# Patient Record
Sex: Female | Born: 1995 | Race: Black or African American | Hispanic: No | Marital: Single | Smoking: Never smoker
Health system: Southern US, Community
[De-identification: ages and names within clinical notes are randomized; demographics above are authoritative.]

---

## 2019-02-22 ENCOUNTER — Ambulatory Visit: Payer: Medicaid Other | Admitting: *Deleted

## 2019-02-22 ENCOUNTER — Other Ambulatory Visit: Payer: Self-pay

## 2019-02-22 DIAGNOSIS — Z3480 Encounter for supervision of other normal pregnancy, unspecified trimester: Secondary | ICD-10-CM

## 2019-02-22 DIAGNOSIS — Z3400 Encounter for supervision of normal first pregnancy, unspecified trimester: Secondary | ICD-10-CM | POA: Insufficient documentation

## 2019-02-22 NOTE — Progress Notes (Signed)
I connected with  Katherine Schmidt on 02/22/19 by a video enabled telemedicine application and verified that I am speaking with the correct person using two identifiers.   I discussed the limitations of evaluation and management by telemedicine. The patient expressed understanding and agreed to proceed.  PRENATAL INTAKE SUMMARY  Katherine Schmidt presents today New OB Nurse Interview.  OB History    Gravida  1   Para      Term      Preterm      AB      Living        SAB      TAB      Ectopic      Multiple      Live Births             I have reviewed the patient's medical, obstetrical, social, and family histories, medications, and available lab results.  SUBJECTIVE She complains of headache Pt states currently being tx for UTI  OBJECTIVE Initial Physical Exam (New OB)  GENERAL APPEARANCE: alert, well appearing   ASSESSMENT Normal pregnancy  PLAN Prenatal care [facility] Continue to take UTI Rx  Advised to take 2 extra strength Tylenol every 6 hours or as needed for HA.  Discuss with provider at visit if no relief.

## 2019-02-22 NOTE — Progress Notes (Signed)
Patient seen and assessed by nursing staff during this encounter. I have reviewed the chart and agree with the documentation and plan.  Mora Bellman, MD 02/22/2019 10:49 AM

## 2019-02-23 NOTE — L&D Delivery Note (Signed)
OB/GYN Faculty Practice Delivery Note  Margretta Crislip is a 24 y.o. G1P0 s/p vaginal delivery at [redacted]w[redacted]d. She was admitted for spontaneous labor.   ROM: 0h 27m with clear fluid GBS Status:  Negative/-- (06/17 1028) Maximum Maternal Temperature:  Temp (24hrs), Avg:98 F (36.7 C), Min:98 F (36.7 C), Max:98 F (36.7 C)    Labor Progress: . Patient arrived at 9 cm dilation proceeded to complete dilation without augmentation. Patient initially reported leaking of vaginal fluids around 2200 the night before delivery.    Delivery Date/Time: 08/12/2019 at 0213 Delivery: Called to room and patient was complete and pushing. Head delivered in OA position. No nuchal cord present. Shoulder and body delivered in usual fashion. Infant with spontaneous cry, placed on mother's abdomen, dried and stimulated. Cord clamped x 2 after 1-minute delay, and cut by maternal grandmother. Cord blood drawn. Placenta delivered spontaneously with gentle cord traction. Fundus firm with massage and Pitocin. Labia, perineum, vagina, and cervix inspected with left labial laceration.  Placenta: spontaneous and intact  Complications: none  Lacerations: left labial, repaired with 4-0 vicryl EBL: 500 Analgesia: IV fentanyl, local  Infant: APGAR (1 MIN): 9   APGAR (5 MINS): 9    Weight: 2934 grams  Nicki Guadalajara, MD Family Medicine PGY-1   OB FELLOW ATTESTATION  I was present, gloved, and supervising throughout delivery and have edited the above note to reflect any changes or updates.  Zack Seal, MD/MPH OB Fellow  08/12/2019, 4:26 AM

## 2019-03-01 ENCOUNTER — Ambulatory Visit (INDEPENDENT_AMBULATORY_CARE_PROVIDER_SITE_OTHER): Payer: Medicaid Other | Admitting: Nurse Practitioner

## 2019-03-01 ENCOUNTER — Other Ambulatory Visit (HOSPITAL_COMMUNITY)
Admission: RE | Admit: 2019-03-01 | Discharge: 2019-03-01 | Disposition: A | Discharge status: Home / Self Care | Payer: Medicaid Other | Source: Ambulatory Visit | Attending: Nurse Practitioner | Admitting: Nurse Practitioner

## 2019-03-01 ENCOUNTER — Encounter: Payer: Self-pay | Admitting: Nurse Practitioner

## 2019-03-01 ENCOUNTER — Other Ambulatory Visit: Payer: Self-pay

## 2019-03-01 VITALS — BP 114/76 | HR 94 | Ht <= 58 in | Wt 107.0 lb

## 2019-03-01 DIAGNOSIS — O26891 Other specified pregnancy related conditions, first trimester: Secondary | ICD-10-CM

## 2019-03-01 DIAGNOSIS — O2341 Unspecified infection of urinary tract in pregnancy, first trimester: Secondary | ICD-10-CM

## 2019-03-01 DIAGNOSIS — G44229 Chronic tension-type headache, not intractable: Secondary | ICD-10-CM

## 2019-03-01 DIAGNOSIS — Z3481 Encounter for supervision of other normal pregnancy, first trimester: Secondary | ICD-10-CM | POA: Diagnosis not present

## 2019-03-01 DIAGNOSIS — Z3480 Encounter for supervision of other normal pregnancy, unspecified trimester: Secondary | ICD-10-CM | POA: Diagnosis present

## 2019-03-01 DIAGNOSIS — Z3A13 13 weeks gestation of pregnancy: Secondary | ICD-10-CM

## 2019-03-01 MED ORDER — BLOOD PRESSURE KIT: 1.0000 | PACK | Freq: Once | 0 refills | Status: AC

## 2019-03-01 MED ORDER — BLOOD PRESSURE CUFF MISC: 1.0000 | Freq: Once | 0 refills | Status: AC

## 2019-03-01 NOTE — Progress Notes (Signed)
PRENATAL VISIT NOTE Attestation of Attending Supervision of Student:  I confirm that I have verified the information documented in the physician assistant student's note and that I have also personally performed the history, physical exam and all medical decision making activities.  I have verified that all services and findings are accurately documented in this student's note; and I agree with management and plan as outlined in the documentation.  Earlie Server, NP  Subjective:  Katherine Schmidt is a 23 y.o. G1P0 at [redacted]w[redacted]d being seen today for her intial prenatal care visit - her EDC is based on LMP.  She is currently monitored for the following issues for this low-risk pregnancy and has Supervision of other normal pregnancy, antepartum and Chronic tension type headache on their problem list.   Patient was recently diagnosed with a UTI at [facility]; she is currently taking Keflex as prescribed and agrees to finish the entire course as instructed. Reports that she still has some back pain, but denies dysuria, hematuria ,fever.   Also reports headache that she first noticed approx. 5 months prior to becoming pregnant. Describes the pain as throbbing across the width of her forehead. These headaches "last while;" she first took 1 tylenol which did not help much. She now has been taking 2 tylenol and states that it helps a bit. Denies associated nausea, vomiting, photophobia, sensitivity to sounds. She has no other complaints.   Patient is taking prenatal gummies 1x/day. States she is going to get a BP cuff today and will start submitting her blood pressure on BabyScripts. She also plans to download MyChart for future visits and questions.  She is not interested in breastfeeding, though she could not give a reason as to why. When informed of the benefits, she became more interested and agreed to consider breastfeeding as an option.    Contractions: Not present. Vag. Bleeding: None.  Movement: Absent.  Denies leaking of fluid.   The following portions of the patient's history were reviewed and updated as appropriate: allergies, current medications, past family history, past medical history, past social history, past surgical history and problem list.   Objective:   Vitals:   03/01/19 1459 03/01/19 1500  BP: 114/76   Pulse: 94   Weight: 107 lb (48.5 kg)   Height:  4\' 10"  (1.473 m)    Fetal Status: Fetal Heart Rate (bpm): 160 Fundal Height: 13 cm Movement: Absent     General:  Alert, oriented and cooperative. Patient is in no acute distress.  Neck:  Supple, trachea midline. Thyroid symmetric and without enlargement or nodules.   Skin: Skin is warm and dry. No rash noted.   Cardiovascular: Normal heart rate noted, regular rhythm. No murmurs, rubs or gallops.   Respiratory: Normal respiratory effort, no problems with respiration noted. No wheezing, rales, rhonchi bilaterally.   Abdomen: Soft, gravid, appropriate for gestational age.  Pain/Pressure: Absent     Pelvic: Cervical exam performed      Vaginal mucosa pink, some white discharge present. Nulliparous cervix without lesions. PAP and Gc/ct swab collected during exam, some bleeding due to contact noted.   Extremities: Normal range of motion.  No edema, cyanosis, or clubbing noted.   Mental Status:  Normal mood and affect. Normal behavior. Normal judgment and thought content.   Breasts: normal appearance, no masses or tenderness, No nipple retraction or dimpling, No nipple discharge or bleeding   Assessment and Plan:  Pregnancy: G1P0 at [redacted]w[redacted]d 1. Supervision of other normal pregnancy, antepartum Patient may  be interested in breastfeeding, continue to encourage.  Continue to use Tylenol PRN for pain and HA, advised against using NSAIDs.   Monitor BP at home and input the information on BabyScripts. Continue taking prenatal vitamins (gummies).  Encouraged patient to sign up for online childbirth and breastfeeding classes.  Patient  is to use MyChart with any questions or concerns that she may have.  - Cytology - PAP( [address]) - Cervicovaginal ancillary only( [place]) - HgB A1c - Urine Culture - Obstetric Panel, Including HIV - Genetic Screening  2. Chronic tension-type headache, not intractable Encouraged adequate hydration of 8 cups of water per day. Continue Tylenol prn to alleviate pain, informed patient to avoid use of NSAIDs during pregnancy.   3. UTI in pregnancy, antepartum, first trimester Finish course of Keflex given by [facility]. Repeat urine culture for clearance at next appointment.   Preterm labor symptoms and general obstetric precautions including but not limited to vaginal bleeding, contractions, leaking of fluid and fetal movement were reviewed in detail with the patient. Please refer to After Visit Summary for other counseling recommendations.   Return in about 4 weeks (around 03/29/2019) for In person ROB and AFP.  Future Appointments  Date Time Provider Department Center  03/29/2019 10:15 AM Calvert Cantor, CNM [facility] None    Nolene Bernheim, RN, MSN, NP-BC Nurse Practitioner, [facility] [employer], [facility] 03/01/2019 4:48 PM

## 2019-03-02 ENCOUNTER — Encounter: Payer: Medicaid Other | Admitting: Women's Health

## 2019-03-02 LAB — OBSTETRIC PANEL, INCLUDING HIV
Antibody Screen: NEGATIVE
Basophils Absolute: 0 10*3/uL (ref 0.0–0.2)
Basos: 1 %
EOS (ABSOLUTE): 0.2 10*3/uL (ref 0.0–0.4)
Eos: 3 %
HIV Screen 4th Generation wRfx: NONREACTIVE
Hematocrit: 35.5 % (ref 34.0–46.6)
Hemoglobin: 12 g/dL (ref 11.1–15.9)
Hepatitis B Surface Ag: NEGATIVE
Immature Grans (Abs): 0.1 10*3/uL (ref 0.0–0.1)
Immature Granulocytes: 1 %
Lymphocytes Absolute: 1.6 10*3/uL (ref 0.7–3.1)
Lymphs: 21 %
MCH: 30.7 pg (ref 26.6–33.0)
MCHC: 33.8 g/dL (ref 31.5–35.7)
MCV: 91 fL (ref 79–97)
Monocytes Absolute: 0.5 10*3/uL (ref 0.1–0.9)
Monocytes: 6 %
Neutrophils Absolute: 5 10*3/uL (ref 1.4–7.0)
Neutrophils: 68 %
Platelets: 302 10*3/uL (ref 150–450)
RBC: 3.91 x10E6/uL (ref 3.77–5.28)
RDW: 12.7 % (ref 11.7–15.4)
RPR Ser Ql: NONREACTIVE
Rh Factor: POSITIVE
Rubella Antibodies, IGG: 2.81 index (ref >0.99)
WBC: 7.3 10*3/uL (ref 3.4–10.8)

## 2019-03-02 LAB — CERVICOVAGINAL ANCILLARY ONLY
Bacterial Vaginitis (gardnerella): POSITIVE — AB
Candida Glabrata: NEGATIVE
Candida Vaginitis: NEGATIVE
Chlamydia: NEGATIVE
Comment: NEGATIVE
Comment: NEGATIVE
Comment: NEGATIVE
Comment: NEGATIVE
Comment: NEGATIVE
Comment: NORMAL
Neisseria Gonorrhea: NEGATIVE
Trichomonas: NEGATIVE

## 2019-03-02 LAB — CYTOLOGY - PAP: Diagnosis: NEGATIVE

## 2019-03-02 LAB — HEMOGLOBIN A1C
Est. average glucose Bld gHb Est-mCnc: 103 mg/dL
Hgb A1c MFr Bld: 5.2 % (ref 4.8–5.6)

## 2019-03-03 LAB — URINE CULTURE: Organism ID, Bacteria: NO GROWTH

## 2019-03-06 ENCOUNTER — Other Ambulatory Visit: Payer: Self-pay

## 2019-03-06 DIAGNOSIS — Z3480 Encounter for supervision of other normal pregnancy, unspecified trimester: Secondary | ICD-10-CM

## 2019-03-06 NOTE — Progress Notes (Signed)
Baby scripts email resent

## 2019-03-08 ENCOUNTER — Encounter: Payer: Self-pay | Admitting: Nurse Practitioner

## 2019-03-29 ENCOUNTER — Other Ambulatory Visit: Payer: Self-pay

## 2019-03-29 ENCOUNTER — Encounter: Payer: Self-pay | Admitting: Advanced Practice Midwife

## 2019-03-29 ENCOUNTER — Ambulatory Visit (INDEPENDENT_AMBULATORY_CARE_PROVIDER_SITE_OTHER): Payer: Medicaid Other | Admitting: Advanced Practice Midwife

## 2019-03-29 VITALS — BP 85/55 | HR 81 | Wt 117.0 lb

## 2019-03-29 DIAGNOSIS — Z3A17 17 weeks gestation of pregnancy: Secondary | ICD-10-CM

## 2019-03-29 DIAGNOSIS — O26892 Other specified pregnancy related conditions, second trimester: Secondary | ICD-10-CM

## 2019-03-29 DIAGNOSIS — Z3480 Encounter for supervision of other normal pregnancy, unspecified trimester: Secondary | ICD-10-CM

## 2019-03-29 DIAGNOSIS — R102 Pelvic and perineal pain: Secondary | ICD-10-CM

## 2019-03-29 NOTE — Progress Notes (Signed)
ROB  CC: pain on left side while laying down.

## 2019-03-29 NOTE — Progress Notes (Signed)
   PRENATAL VISIT NOTE  Subjective:  Katherine Schmidt is a 24 y.o. G1P0 at [redacted]w[redacted]d being seen today for ongoing prenatal care.  She is currently monitored for the following issues for this low-risk pregnancy and has Supervision of other normal pregnancy, antepartum and Chronic tension type headache on their problem list.  Patient reports occurrence of left pelvic/groin pain, new onset. Occurred two weeks ago and has not happened since..  Contractions: Not present. Vag. Bleeding: None.  Movement: Absent. Denies leaking of fluid.   The following portions of the patient's history were reviewed and updated as appropriate: allergies, current medications, past family history, past medical history, past social history, past surgical history and problem list. Problem list updated.  Objective:   Vitals:   03/29/19 1023  BP: (!) 85/55  Pulse: 81  Weight: 117 lb (53.1 kg)    Fetal Status: Fetal Heart Rate (bpm): 140   Movement: Absent     General:  Alert, oriented and cooperative. Patient is in no acute distress.  Skin: Skin is warm and dry. No rash noted.   Cardiovascular: Normal heart rate noted  Respiratory: Normal respiratory effort, no problems with respiration noted  Abdomen: Soft, gravid, appropriate for gestational age.  Pain/Pressure: Present     Pelvic: Cervical exam deferred        Extremities: Normal range of motion.  Edema: None  Mental Status: Normal mood and affect. Normal behavior. Normal judgment and thought content.   Assessment and Plan:  Pregnancy: G1P0 at [redacted]w[redacted]d  1. Supervision of other normal pregnancy, antepartum - S/p normal NIPS, discussed AFP, declines today - [place] MFM OB COMP + 14 WK; Future  2. Pelvic pain affecting pregnancy in second trimester, antepartum - Remote, two weeks ago - Discussed monitoring for recurrence, gentle stretching and exercise  Preterm labor symptoms and general obstetric precautions including but not limited to vaginal bleeding, contractions,  leaking of fluid and fetal movement were reviewed in detail with the patient. Please refer to After Visit Summary for other counseling recommendations.  Return in about 4 weeks (around 04/26/2019) for Virtual.  Future Appointments  Date Time Provider Department Center  04/26/2019 10:55 AM Sharyon Cable, CNM [facility] None    Calvert Cantor, [place]

## 2019-03-29 NOTE — Patient Instructions (Signed)

## 2019-04-12 ENCOUNTER — Ambulatory Visit (HOSPITAL_COMMUNITY): Payer: Medicaid Other

## 2019-04-26 ENCOUNTER — Encounter: Payer: Self-pay | Admitting: Certified Nurse Midwife

## 2019-04-26 ENCOUNTER — Telehealth (INDEPENDENT_AMBULATORY_CARE_PROVIDER_SITE_OTHER): Payer: Medicaid Other | Admitting: Certified Nurse Midwife

## 2019-04-26 DIAGNOSIS — Z3482 Encounter for supervision of other normal pregnancy, second trimester: Secondary | ICD-10-CM

## 2019-04-26 DIAGNOSIS — Z3480 Encounter for supervision of other normal pregnancy, unspecified trimester: Secondary | ICD-10-CM

## 2019-04-26 NOTE — Progress Notes (Signed)
   TELEHEALTH OBSTETRICS PRENATAL VIRTUAL VIDEO VISIT ENCOUNTER NOTE  Provider location: Center for [facility]   I connected with [name] on 04/26/19 at 10:33 AM EST by MyChart Video Encounter at home and verified that I am speaking with the correct person using two identifiers.   I discussed the limitations, risks, security and privacy concerns of performing an evaluation and management service virtually and the availability of in person appointments. I also discussed with the patient that there may be a patient responsible charge related to this service. The patient expressed understanding and agreed to proceed. Subjective:  Katherine Schmidt is a 24 y.o. G1P0 at [redacted]w[redacted]d being seen today for ongoing prenatal care.  She is currently monitored for the following issues for this low-risk pregnancy and has Supervision of other normal pregnancy, antepartum and Chronic tension type headache on their problem list.  Patient reports no complaints.  Contractions: Not present.  .  Movement: Present. Denies any leaking of fluid.   The following portions of the patient's history were reviewed and updated as appropriate: allergies, current medications, past family history, past medical history, past social history, past surgical history and problem list.   Objective:   Vitals:   04/26/19 1034  BP: 118/69  Pulse: 95    Fetal Status:     Movement: Present     General:  Alert, oriented and cooperative. Patient is in no acute distress.  Respiratory: Normal respiratory effort, no problems with respiration noted  Mental Status: Normal mood and affect. Normal behavior. Normal judgment and thought content.  Rest of physical exam deferred due to type of encounter  Imaging: No results found.  Assessment and Plan:  Pregnancy: G1P0 at [redacted]w[redacted]d 1. Supervision of other normal pregnancy, antepartum - patient doing well, no complaints - routine prenatal care - anticipatory guidance on  upcoming appointments including GTT at next visit, encouraged patient to fast after MN prior to that appointment - anatomy US scheduled for tomorrow, will obtain AFP at that appointment - educated and discussed RLP and what to expect with pregnancy over the next 4 weeks - educated and discussed circumcision options, patient plans outpatient circ   Preterm labor symptoms and general obstetric precautions including but not limited to vaginal bleeding, contractions, leaking of fluid and fetal movement were reviewed in detail with the patient. I discussed the assessment and treatment plan with the patient. The patient was provided an opportunity to ask questions and all were answered. The patient agreed with the plan and demonstrated an understanding of the instructions. The patient was advised to call back or seek an in-person office evaluation/go to MAU at [facility] for any urgent or concerning symptoms. Please refer to After Visit Summary for other counseling recommendations.   I provided 10 minutes of face-to-face time during this encounter.  Return in about 5 weeks (around 05/31/2019) for ROB/GTT.  Future Appointments  Date Time Provider Department Center  04/27/2019 11:15 AM [facility] [place] 4 [facility]  05/30/2019  8:15 AM [facility] None  05/30/2019  8:35 AM Sharyon Cable, CNM [facility] None    Sharyon Cable, CNM Center for [employer], [facility]

## 2019-04-27 ENCOUNTER — Other Ambulatory Visit (HOSPITAL_COMMUNITY): Payer: Self-pay | Admitting: *Deleted

## 2019-04-27 ENCOUNTER — Other Ambulatory Visit: Payer: Self-pay | Admitting: Advanced Practice Midwife

## 2019-04-27 ENCOUNTER — Other Ambulatory Visit: Payer: Self-pay

## 2019-04-27 ENCOUNTER — Ambulatory Visit (HOSPITAL_COMMUNITY)
Admission: RE | Admit: 2019-04-27 | Discharge: 2019-04-27 | Disposition: A | Discharge status: Home / Self Care | Payer: Medicaid Other | Source: Ambulatory Visit | Attending: Obstetrics and Gynecology | Admitting: Obstetrics and Gynecology

## 2019-04-27 DIAGNOSIS — O358XX0 Maternal care for other (suspected) fetal abnormality and damage, not applicable or unspecified: Secondary | ICD-10-CM | POA: Diagnosis not present

## 2019-04-27 DIAGNOSIS — Z3480 Encounter for supervision of other normal pregnancy, unspecified trimester: Secondary | ICD-10-CM | POA: Diagnosis present

## 2019-04-27 DIAGNOSIS — Z3A21 21 weeks gestation of pregnancy: Secondary | ICD-10-CM | POA: Diagnosis not present

## 2019-04-27 DIAGNOSIS — Z362 Encounter for other antenatal screening follow-up: Secondary | ICD-10-CM

## 2019-05-29 ENCOUNTER — Other Ambulatory Visit: Payer: Self-pay

## 2019-05-29 ENCOUNTER — Ambulatory Visit (HOSPITAL_COMMUNITY)
Admission: RE | Admit: 2019-05-29 | Discharge: 2019-05-29 | Disposition: A | Discharge status: Home / Self Care | Payer: Medicaid Other | Source: Ambulatory Visit | Attending: Obstetrics and Gynecology | Admitting: Obstetrics and Gynecology

## 2019-05-29 DIAGNOSIS — Z3A26 26 weeks gestation of pregnancy: Secondary | ICD-10-CM

## 2019-05-29 DIAGNOSIS — Z362 Encounter for other antenatal screening follow-up: Secondary | ICD-10-CM | POA: Diagnosis not present

## 2019-05-30 ENCOUNTER — Other Ambulatory Visit: Payer: Medicaid Other

## 2019-05-30 ENCOUNTER — Encounter: Payer: Self-pay | Admitting: Certified Nurse Midwife

## 2019-05-30 ENCOUNTER — Ambulatory Visit (INDEPENDENT_AMBULATORY_CARE_PROVIDER_SITE_OTHER): Payer: Medicaid Other | Admitting: Certified Nurse Midwife

## 2019-05-30 ENCOUNTER — Other Ambulatory Visit: Payer: Self-pay

## 2019-05-30 DIAGNOSIS — Z3A26 26 weeks gestation of pregnancy: Secondary | ICD-10-CM

## 2019-05-30 DIAGNOSIS — Z3400 Encounter for supervision of normal first pregnancy, unspecified trimester: Secondary | ICD-10-CM

## 2019-05-30 DIAGNOSIS — Z23 Encounter for immunization: Secondary | ICD-10-CM

## 2019-05-30 DIAGNOSIS — Z3402 Encounter for supervision of normal first pregnancy, second trimester: Secondary | ICD-10-CM

## 2019-05-30 NOTE — Progress Notes (Signed)
   PRENATAL VISIT NOTE  Subjective:  Katherine Schmidt is a 24 y.o. G1P0 at [redacted]w[redacted]d being seen today for ongoing prenatal care.  She is currently monitored for the following issues for this low-risk pregnancy and has Supervision of normal first pregnancy and Chronic tension type headache on their problem list.  Patient reports no complaints.  Contractions: Not present. Vag. Bleeding: None.  Movement: Present. Denies leaking of fluid.   The following portions of the patient's history were reviewed and updated as appropriate: allergies, current medications, past family history, past medical history, past social history, past surgical history and problem list.   Objective:   Vitals:   05/30/19 0836  BP: 111/67  Pulse: 69  Weight: 126 lb (57.2 kg)    Fetal Status: Fetal Heart Rate (bpm): 130 Fundal Height: 27 cm Movement: Present     General:  Alert, oriented and cooperative. Patient is in no acute distress.  Skin: Skin is warm and dry. No rash noted.   Cardiovascular: Normal heart rate noted  Respiratory: Normal respiratory effort, no problems with respiration noted  Abdomen: Soft, gravid, appropriate for gestational age.  Pain/Pressure: Absent     Pelvic: Cervical exam deferred        Extremities: Normal range of motion.  Edema: None  Mental Status: Normal mood and affect. Normal behavior. Normal judgment and thought content.   Assessment and Plan:  Pregnancy: G1P0 at [redacted]w[redacted]d 1. Supervision of normal first pregnancy, antepartum - Patient doing well, no complaints - educated and discussed results of GTT and what it means with abnormal results  - routine prenatal care - anticipatory guidance on upcoming appointments  - Patient reports she is taking BPs at home but not logging into app, patient reports that her babyscripts app is not working and has not worked, patient reports that she is unable to even log in - will try to reset babyscripts while in office today  - Educated and discussed  possible occurrence of RLP during pregnancy  - Glucose Tolerance, 2 Hours w/1 Hour - RPR - CBC - HIV antibody (with reflex) - Tdap vaccine greater than or equal to 7yo IM - Babyscripts Schedule Optimization  Preterm labor symptoms and general obstetric precautions including but not limited to vaginal bleeding, contractions, leaking of fluid and fetal movement were reviewed in detail with the patient. Please refer to After Visit Summary for other counseling recommendations.   Return in about 4 weeks (around 06/27/2019) for ROB-mychart.  Sharyon Cable, CNM

## 2019-05-30 NOTE — Progress Notes (Signed)
Patient reports fetal movement, denies pain. Pt states she wants tdap today, administered and pt tolerated well.

## 2019-05-30 NOTE — Patient Instructions (Signed)

## 2019-05-31 LAB — CBC
Hematocrit: 34.3 % (ref 34.0–46.6)
Hemoglobin: 11.3 g/dL (ref 11.1–15.9)
MCH: 31.5 pg (ref 26.6–33.0)
MCHC: 32.9 g/dL (ref 31.5–35.7)
MCV: 96 fL (ref 79–97)
Platelets: 193 10*3/uL (ref 150–450)
RBC: 3.59 x10E6/uL — ABNORMAL LOW (ref 3.77–5.28)
RDW: 12.1 % (ref 11.7–15.4)
WBC: 6.5 10*3/uL (ref 3.4–10.8)

## 2019-05-31 LAB — GLUCOSE TOLERANCE, 2 HOURS W/ 1HR
Glucose, 1 hour: 135 mg/dL (ref 65–179)
Glucose, 2 hour: 116 mg/dL (ref 65–152)
Glucose, Fasting: 76 mg/dL (ref 65–91)

## 2019-05-31 LAB — RPR: RPR Ser Ql: NONREACTIVE

## 2019-05-31 LAB — HIV ANTIBODY (ROUTINE TESTING W REFLEX): HIV Screen 4th Generation wRfx: NONREACTIVE

## 2019-06-27 ENCOUNTER — Encounter: Payer: Self-pay | Admitting: Certified Nurse Midwife

## 2019-06-27 ENCOUNTER — Telehealth (INDEPENDENT_AMBULATORY_CARE_PROVIDER_SITE_OTHER): Payer: Medicaid Other | Admitting: Certified Nurse Midwife

## 2019-06-27 DIAGNOSIS — Z3400 Encounter for supervision of normal first pregnancy, unspecified trimester: Secondary | ICD-10-CM

## 2019-06-27 NOTE — Progress Notes (Signed)
Pt is on the phone preparing for virtual visit, [redacted]w[redacted]d.

## 2019-06-27 NOTE — Patient Instructions (Signed)

## 2019-06-27 NOTE — Progress Notes (Signed)
OBSTETRICS PRENATAL VIRTUAL VISIT ENCOUNTER NOTE  Provider location: [facility] [place]   I connected with Ronique Elster on 06/27/19 at  8:20 AM EDT by MyChart Video Encounter at home and verified that I am speaking with the correct person using two identifiers.   I discussed the limitations, risks, security and privacy concerns of performing an evaluation and management service virtually and the availability of in person appointments. I also discussed with the patient that there may be a patient responsible charge related to this service. The patient expressed understanding and agreed to proceed. Subjective:  Katherine Schmidt is a 24 y.o. G1P0 at [redacted]w[redacted]d being seen today for ongoing prenatal care.  She is currently monitored for the following issues for this low-risk pregnancy and has Supervision of normal first pregnancy and Chronic tension type headache on their problem list.  Patient reports no complaints.  Contractions: Irritability. Vag. Bleeding: None.  Movement: Present. Denies any leaking of fluid.   The following portions of the patient's history were reviewed and updated as appropriate: allergies, current medications, past family history, past medical history, past social history, past surgical history and problem list.   Objective:   Vitals:   06/27/19 0814  BP: 117/65    Fetal Status:     Movement: Present     General:  Alert, oriented and cooperative. Patient is in no acute distress.  Respiratory: Normal respiratory effort, no problems with respiration noted  Mental Status: Normal mood and affect. Normal behavior. Normal judgment and thought content.  Rest of physical exam deferred due to type of encounter  Imaging: [place] MFM OB FOLLOW UP  Result Date: 05/29/2019 ----------------------------------------------------------------------  OBSTETRICS REPORT                       (Signed Final 05/29/2019 02:46 pm)  ---------------------------------------------------------------------- Patient Info  ID #:       [id number]                          D.O.B.:  02-02-1996 (24 yrs)  Name:       Katherine Schmidt                  Visit Date: 05/29/2019 01:37 pm ---------------------------------------------------------------------- Performed By  Performed By:     Jacob Moores BS,       Referred By:      Crissie Figures                    RDMS, RVT                                [place]  Attending:        Johnell Comings MD         [facility] ---------------------------------------------------------------------- Orders   #  Description  Code         Ordered By   1  US MFM OB FOLLOW UP                  [id number]     Noralee SpaceAVI SHANKAR  ----------------------------------------------------------------------   #  Order #                    Accession #                 Episode #   1  [id number]                  [id number]                  [id number]  ---------------------------------------------------------------------- Indications   Echogenic intracardiac focus of the heart      O35.8XX0   (EIF)   [redacted] weeks gestation of pregnancy                Z3A.26   Antenatal follow-up for nonvisualized fetal    Z36.2   anatomy  ---------------------------------------------------------------------- Vital Signs                                                 Height:        4'10" ---------------------------------------------------------------------- Fetal Evaluation  Num Of Fetuses:         1  Fetal Heart Rate(bpm):  160  Cardiac Activity:       Observed  Presentation:           Cephalic  Placenta:               Anterior  P. Cord Insertion:      Previously Visualized  Amniotic Fluid  AFI FV:      Subjectively upper-normal                              Largest Pocket(cm)                              8.21  ---------------------------------------------------------------------- Biometry  BPD:      72.4  mm     G. Age:  29w 0d         98  %    CI:        79.58   %    70 - 86                                                          FL/HC:      19.7   %    18.6 - 20.4  HC:      256.5  mm     G. Age:  27w 6d         73  %    HC/AC:      1.13        1.04 - 1.22  AC:      226.2  mm     G. Age:  27w 470d  60  %    FL/BPD:     69.8   %    71 - 87  FL:       50.5  mm     G. Age:  27w 1d         57  %    FL/AC:      22.3   %    20 - 24  CER:      32.1  mm     G. Age:  28w 1d         78  %  LV:        3.9  mm  Est. FW:    1048  gm      2 lb 5 oz     73  % ---------------------------------------------------------------------- OB History  Gravidity:    1 ---------------------------------------------------------------------- Gestational Age  LMP:           26w 3d        Date:  11/25/18                 EDD:   09/01/19  U/S Today:     27w 5d                                        EDD:   08/23/19  Best:          26w 3d     Det. By:  LMP  (11/25/18)          EDD:   09/01/19 ---------------------------------------------------------------------- Anatomy  Cranium:               Appears normal         Aortic Arch:            Appears normal  Cavum:                 Appears normal         Ductal Arch:            Appears normal  Ventricles:            Appears normal         Diaphragm:              Appears normal  Choroid Plexus:        Appears normal         Stomach:                Appears normal, left                                                                        sided  Cerebellum:            Appears normal         Abdomen:                Previously seen  Posterior Fossa:       Previously seen        Abdominal Wall:         Previously seen  Nuchal Fold:  Not applicable (>20    Cord Vessels:           Previously seen                         wks GA)  Face:                  Orbits and profile     Kidneys:                 Appear normal                         previously seen  Lips:                  Previously seen        Bladder:                Appears normal  Thoracic:              Previously seen        Spine:                  Previously seen  Heart:                 Echogenic focus        Upper Extremities:      Previously seen                         in LV  RVOT:                  Appears normal         Lower Extremities:      Previously seen  LVOT:                  Appears normal  Other:  Heels and 5th digit visualized. Technically difficult due to fetal position. ---------------------------------------------------------------------- Cervix Uterus Adnexa  Cervix  Not visualized (advanced GA >24wks)  Uterus  No abnormality visualized.  Left Ovary  Within normal limits. No adnexal mass visualized.  Right Ovary  Within normal limits. No adnexal mass visualized.  Cul De Sac  No free fluid seen.  Adnexa  No abnormality visualized. ---------------------------------------------------------------------- Comments  This patient was seen for a follow up exam as the views of  the fetal anatomy were unable to be fully visualized during  her last exam.  She denies any problems since her last exam.  She was informed that the fetal growth and amniotic fluid  level appears appropriate for her gestational age.  The views of the fetal anatomy were visualized today.  There  were no obvious anomalies noted, other than the previously  noted echogenic focus in the left ventricle of the fetal heart.  The limitations of ultrasound in the detection of all anomalies  was discussed.  Follow-up as indicated. ----------------------------------------------------------------------                   Ma Rings, MD Electronically Signed Final Report   05/29/2019 02:46 pm ----------------------------------------------------------------------   Assessment and Plan:  Pregnancy: G1P0 at [redacted]w[redacted]d 1. Supervision of normal first pregnancy, antepartum - Patient doing  well, no complaints - routine prenatal care  - anticipatory guidance on upcoming appointments  - educated and discussed BH contractions and what to expect over next couple of weeks  - educated and discussed contraception options including LARCs  and postplacental IUD, patient undecided at this time    Preterm labor symptoms and general obstetric precautions including but not limited to vaginal bleeding, contractions, leaking of fluid and fetal movement were reviewed in detail with the patient. I discussed the assessment and treatment plan with the patient. The patient was provided an opportunity to ask questions and all were answered. The patient agreed with the plan and demonstrated an understanding of the instructions. The patient was advised to call back or seek an in-person office evaluation/go to [facility] at [facility] for any urgent or concerning symptoms. Please refer to After Visit Summary for other counseling recommendations.   I provided 8 minutes of face-to-face time during this encounter.  Return in about 3 weeks (around 07/18/2019) for ROB-mychart.  Future Appointments  Date Time Provider Department Center  07/19/2019  9:35 AM Sharyon Cable, CNM [facility] None    Sharyon Cable, CNM [facility] [employer], [facility]

## 2019-07-09 ENCOUNTER — Telehealth: Payer: Self-pay

## 2019-07-09 NOTE — Telephone Encounter (Signed)
Returned call x2, rings and then phone disconnects

## 2019-07-10 ENCOUNTER — Telehealth: Payer: Self-pay

## 2019-07-10 NOTE — Telephone Encounter (Signed)
Returned call and pt stated that she had light spotting yesterday when she wiped and is now having some green vaginal discharge. Advised pt that scheduler will call for appt.

## 2019-07-12 ENCOUNTER — Other Ambulatory Visit: Payer: Self-pay

## 2019-07-12 ENCOUNTER — Other Ambulatory Visit (HOSPITAL_COMMUNITY)
Admission: RE | Admit: 2019-07-12 | Discharge: 2019-07-12 | Disposition: A | Discharge status: Home / Self Care | Payer: Medicaid Other | Source: Ambulatory Visit | Attending: Certified Nurse Midwife | Admitting: Certified Nurse Midwife

## 2019-07-12 ENCOUNTER — Ambulatory Visit (INDEPENDENT_AMBULATORY_CARE_PROVIDER_SITE_OTHER): Payer: Medicaid Other

## 2019-07-12 VITALS — BP 102/65 | HR 87 | Wt 124.1 lb

## 2019-07-12 DIAGNOSIS — Z3403 Encounter for supervision of normal first pregnancy, third trimester: Secondary | ICD-10-CM | POA: Insufficient documentation

## 2019-07-12 DIAGNOSIS — O26892 Other specified pregnancy related conditions, second trimester: Secondary | ICD-10-CM

## 2019-07-12 DIAGNOSIS — N898 Other specified noninflammatory disorders of vagina: Secondary | ICD-10-CM | POA: Insufficient documentation

## 2019-07-12 MED ORDER — METRONIDAZOLE 500 MG PO TABS: 500.0000 mg | ORAL_TABLET | Freq: Two times a day (BID) | ORAL | 0 refills | Status: DC

## 2019-07-12 NOTE — Progress Notes (Signed)
   PRENATAL VISIT NOTE  Subjective:  Katherine Schmidt is a 24 y.o. G1P0 at [redacted]w[redacted]d who presents today for routine prenatal care.  She is currently being monitored for supervision of a low-risk pregnancy with problems as listed below.  Patient reports some vaginal bleeding and yellow discharge earlier this week.  Patient also reports some lower abdominal pain that "felt like he wanted to just slide out."  Patient reports the pain was constant in nature, but has subsided without intervention. She states she had sex after noting the bleeding and discharge with some pain and discomfort after activity. Patient endorses fetal movement.    Patient Active Problem List   Diagnosis Date Noted  . Chronic tension type headache 03/01/2019  . Supervision of normal first pregnancy 02/22/2019    The following portions of the patient's history were reviewed and updated as appropriate: allergies, current medications, past family history, past medical history, past social history, past surgical history and problem list. Problem list updated.  Objective:   Vitals:   07/12/19 0921  BP: 102/65  Pulse: 87  Weight: 124 lb 1.6 oz (56.3 kg)    Fetal Status: Fetal Heart Rate (bpm): 132  Fundal Height: 33 cm Movement: Present     General:  Alert, oriented and cooperative. Patient is in no acute distress.  Skin: Skin is warm and dry.   Cardiovascular: Regular rate and rhythm.  Respiratory: Normal respiratory effort. CTA-Bilaterally  Abdomen: Soft, gravid, appropriate for gestational age.  Pelvic: Cervical exam performed Dilation: Fingertip Effacement (%): Thick Station: Ballotable  Extremities: Normal range of motion.  Edema: None  Mental Status: Normal mood and affect. Normal behavior. Normal judgment and thought content.   Assessment and Plan:  Pregnancy: G1P0 at [redacted]w[redacted]d  1. Encounter for supervision of normal first pregnancy in third trimester -Anticipatory guidance for upcoming appts. -Discussed GBS  including what it is, how we test, and why we collect it. -Informed that next appt would also perform TOC if relevant.  -Patient states she does not desire birth control method.  2. Vaginal discharge during pregnancy in second trimester -Patient with history of trich and GC/CT prior to pregnancy. -CV collected today. -Informed that findings are suspicious for trich, but BV could also be causing issues. -Will give script for Flagyl 500mg  BID., -Informed that sample would be sent and if need be further medications would be sent to pharmacy. -Will release results to mychart.  -Instructed to await results before confronting BF with diagnosis!  Preterm labor symptoms and general obstetric precautions including but not limited to vaginal bleeding, contractions, leaking of fluid and fetal movement were reviewed with the patient.  Please refer to After Visit Summary for other counseling recommendations.  Return in about 4 weeks (around 08/09/2019) for LR-ROB with GBS.  No future appointments.  08/11/2019, CNM 07/12/2019, 9:49 AM

## 2019-07-12 NOTE — Progress Notes (Signed)
Pt presents for ROB c/o "pinkish and yellow" vaginal discharge x few days  Denies odor/itching.

## 2019-07-16 ENCOUNTER — Inpatient Hospital Stay (HOSPITAL_COMMUNITY)
Admission: EM | Admit: 2019-07-16 | Discharge: 2019-07-16 | Disposition: A | Discharge status: Home / Self Care | Payer: Medicaid Other | Attending: Obstetrics and Gynecology | Admitting: Obstetrics and Gynecology

## 2019-07-16 ENCOUNTER — Other Ambulatory Visit: Payer: Self-pay

## 2019-07-16 ENCOUNTER — Encounter (HOSPITAL_COMMUNITY): Payer: Self-pay | Admitting: Obstetrics and Gynecology

## 2019-07-16 DIAGNOSIS — N858 Other specified noninflammatory disorders of uterus: Secondary | ICD-10-CM

## 2019-07-16 DIAGNOSIS — O479 False labor, unspecified: Secondary | ICD-10-CM

## 2019-07-16 DIAGNOSIS — A5901 Trichomonal vulvovaginitis: Secondary | ICD-10-CM | POA: Diagnosis not present

## 2019-07-16 DIAGNOSIS — O98313 Other infections with a predominantly sexual mode of transmission complicating pregnancy, third trimester: Secondary | ICD-10-CM | POA: Diagnosis not present

## 2019-07-16 DIAGNOSIS — O4700 False labor before 37 completed weeks of gestation, unspecified trimester: Secondary | ICD-10-CM

## 2019-07-16 DIAGNOSIS — O4703 False labor before 37 completed weeks of gestation, third trimester: Secondary | ICD-10-CM | POA: Diagnosis present

## 2019-07-16 DIAGNOSIS — Z3A33 33 weeks gestation of pregnancy: Secondary | ICD-10-CM

## 2019-07-16 LAB — WET PREP, GENITAL
Clue Cells Wet Prep HPF POC: NONE SEEN
Sperm: NONE SEEN
Yeast Wet Prep HPF POC: NONE SEEN

## 2019-07-16 LAB — FETAL FIBRONECTIN: Fetal Fibronectin: NEGATIVE

## 2019-07-16 MED ORDER — LACTATED RINGERS IV SOLN: INTRAVENOUS | Status: DC

## 2019-07-16 MED ORDER — BETAMETHASONE SOD PHOS & ACET 6 (3-3) MG/ML IJ SUSP: 12.0000 mg | Freq: Once | INTRAMUSCULAR | Status: DC

## 2019-07-16 MED ORDER — NIFEDIPINE 10 MG PO CAPS
10.0000 mg | ORAL_CAPSULE | ORAL | Status: DC | PRN
  Administered 2019-07-16 (×2): 10 mg via ORAL
  Filled 2019-07-16 (×3): qty 1

## 2019-07-16 MED ORDER — TERBUTALINE SULFATE 1 MG/ML IJ SOLN
0.2500 mg | Freq: Once | INTRAMUSCULAR | Status: AC
  Administered 2019-07-16: 0.25 mg via SUBCUTANEOUS
  Filled 2019-07-16: qty 1

## 2019-07-16 MED ORDER — METRONIDAZOLE 500 MG PO TABS
2000.0000 mg | ORAL_TABLET | Freq: Once | ORAL | Status: AC
  Administered 2019-07-16: 2000 mg via ORAL
  Filled 2019-07-16: qty 4

## 2019-07-16 MED ORDER — BETAMETHASONE SOD PHOS & ACET 6 (3-3) MG/ML IJ SUSP
12.0000 mg | Freq: Once | INTRAMUSCULAR | Status: AC
  Administered 2019-07-16: 12 mg via INTRAMUSCULAR
  Filled 2019-07-16: qty 5

## 2019-07-16 NOTE — Discharge Instructions (Signed)
Preterm Labor and Birth Information Pregnancy normally lasts 39-41 weeks. Preterm labor is when labor starts early. It starts before you have been pregnant for 37 whole weeks. What are the risk factors for preterm labor? Preterm labor is more likely to occur in women who:  Have an infection while pregnant.  Have a cervix that is short.  Have gone into preterm labor before.  Have had surgery on their cervix.  Are younger than age 26.  Are older than age 14.  Are African American.  Are pregnant with two or more babies.  Take street drugs while pregnant.  Smoke while pregnant.  Do not gain enough weight while pregnant.  Got pregnant right after another pregnancy. What are the symptoms of preterm labor? Symptoms of preterm labor include:  Cramps. The cramps may feel like the cramps some women get during their period. The cramps may happen with watery poop (diarrhea).  Pain in the belly (abdomen).  Pain in the lower back.  Regular contractions or tightening. It may feel like your belly is getting tighter.  Pressure in the lower belly that seems to get stronger.  More fluid (discharge) leaking from the vagina. The fluid may be watery or bloody.  Water breaking. Why is it important to notice signs of preterm labor? Babies who are born early may not be fully developed. They have a higher chance for:  Long-term heart problems.  Long-term lung problems.  Trouble controlling body systems, like breathing.  Bleeding in the brain.  A condition called cerebral palsy.  Learning difficulties.  Death. These risks are highest for babies who are born before 29 weeks of pregnancy. How is preterm labor treated? Treatment depends on:  How long you were pregnant.  Your condition.  The health of your baby. Treatment may involve:  Having a stitch (suture) placed in your cervix. When you give birth, your cervix opens so the baby can come out. The stitch keeps the cervix  from opening too soon.  Staying at the hospital.  Taking or getting medicines, such as: ? Hormone medicines. ? Medicines to stop contractions. ? Medicines to help the babys lungs develop. ? Medicines to prevent your baby from having cerebral palsy. What should I do if I am in preterm labor? If you think you are going into labor too soon, call your doctor right away. How can I prevent preterm labor?  Do not use any tobacco products. ? Examples of these are cigarettes, chewing tobacco, and e-cigarettes. ? If you need help quitting, ask your doctor.  Do not use street drugs.  Do not use any medicines unless you ask your doctor if they are safe for you.  Talk with your doctor before taking any herbal supplements.  Make sure you gain enough weight.  Watch for infection. If you think you might have an infection, get it checked right away.  If you have gone into preterm labor before, tell your doctor. This information is not intended to replace advice given to you by your health care provider. Make sure you discuss any questions you have with your health care provider. Document Revised: 06/02/2018 Document Reviewed: 07/02/2015 Elsevier Patient Education  [place]. Trichomoniasis Trichomoniasis is an STI (sexually transmitted infection) that can affect both women and men. In women, the outer area of the female genitalia (vulva) and the vagina are affected. In men, mainly the penis is affected, but the prostate and other reproductive organs can also be involved.  This condition can be  treated with medicine. It often has no symptoms (is asymptomatic), especially in men. If not treated, trichomoniasis can last for months or years. What are the causes? This condition is caused by a parasite called Trichomonas vaginalis. Trichomoniasis most often spreads from person to person (is contagious) through sexual contact. What increases the risk? The following factors may make you more  likely to develop this condition:  Having unprotected sex.  Having sex with a partner who has trichomoniasis.  Having multiple sexual partners.  Having had previous trichomoniasis infections or other STIs. What are the signs or symptoms? In women, symptoms of trichomoniasis include:  Abnormal vaginal discharge that is clear, white, gray, or yellow-green and foamy and has an unusual "fishy" odor.  Itching and irritation of the vagina and vulva.  Burning or pain during urination or sex.  Redness and swelling of the genitals. In men, symptoms of trichomoniasis include:  Penile discharge that may be foamy or contain pus.  Pain in the penis. This may happen only when urinating.  Itching or irritation inside the penis.  Burning after urination or ejaculation. How is this diagnosed? In women, this condition may be found during a routine Pap test or physical exam. It may be found in men during a routine physical exam. Your health care provider may do tests to help diagnose this infection, such as:  Urine tests (men and women).  The following in women: ? Testing the pH of the vagina. ? A vaginal swab test that checks for the Trichomonas vaginalis parasite. ? Testing vaginal secretions. Your health care provider may test you for other STIs, including HIV (human immunodeficiency virus). How is this treated? This condition is treated with medicine taken by mouth (orally), such as metronidazole or tinidazole, to fight the infection. Your sexual partner(s) also need to be tested and treated.  If you are a woman and you plan to become pregnant or think you may be pregnant, tell your health care provider right away. Some medicines that are used to treat the infection should not be taken during pregnancy. Your health care provider may recommend over-the-counter medicines or creams to help relieve itching or irritation. You may be tested for infection again 3 months after treatment. Follow  these instructions at home:  Take and use over-the-counter and prescription medicines, including creams, only as told by your health care provider.  Take your antibiotic medicine as told by your health care provider. Do not stop taking the antibiotic even if you start to feel better.  Do not have sex until 7-10 days after you finish your medicine, or until your health care provider approves. Ask your health care provider when you may start to have sex again.  (Women) Do not douche or wear tampons while you have the infection.  Discuss your infection with your sexual partner(s). Make sure that your partner gets tested and treated, if necessary.  Keep all follow-up visits as told by your health care provider. This is important. How is this prevented?   Use condoms every time you have sex. Using condoms correctly and consistently can help protect against STIs.  Avoid having multiple sexual partners.  Talk with your sexual partner about any symptoms that either of you may have, as well as any history of STIs.  Get tested for STIs and STDs (sexually transmitted diseases) before you have sex. Ask your partner to do the same.  Do not have sexual contact if you have symptoms of trichomoniasis or another STI. Contact a  health care provider if:  You still have symptoms after you finish your medicine.  You develop pain in your abdomen.  You have pain when you urinate.  You have bleeding after sex.  You develop a rash.  You feel nauseous or you vomit.  You plan to become pregnant or think you may be pregnant. Summary  Trichomoniasis is an STI (sexually transmitted infection) that can affect both women and men.  This condition often has no symptoms (is asymptomatic), especially in men.  Without treatment, this condition can last for months or years.  You should not have sex until 7-10 days after you finish your medicine, or until your health care provider approves. Ask your health  care provider when you may start to have sex again.  Discuss your infection with your sexual partner(s). Make sure that your partner gets tested and treated, if necessary. This information is not intended to replace advice given to you by your health care provider. Make sure you discuss any questions you have with your health care provider. Document Revised: 11/22/2017 Document Reviewed: 11/22/2017 Elsevier Patient Education  2020 [employer].

## 2019-07-16 NOTE — MAU Provider Note (Addendum)
Chief Complaint:  Contractions   First Provider Initiated Contact with Patient 07/16/19 0056     HPI: Katherine Schmidt is a 24 y.o. G1P0 at 8w2dwho presents to maternity admissions reporting lower abdominal pain with contractions for the past hour or so.  Appears uncomfortable with them. . She reports good fetal movement, denies LOF, vaginal bleeding, vaginal itching/burning, urinary symptoms, h/a, dizziness, n/v, diarrhea, constipation or fever/chills.    Abdominal Pain This is a new problem. The current episode started today. The problem occurs intermittently. The problem has been unchanged. The pain is located in the LLQ, RLQ and suprapubic region. The quality of the pain is cramping. The abdominal pain does not radiate. Pertinent negatives include no constipation, diarrhea, dysuria, fever, frequency, myalgias, nausea or vomiting. Nothing aggravates the pain. The pain is relieved by nothing. She has tried nothing for the symptoms.   RN note: Pt reports to MAU from [facility] stating she is having ctx every 5-10 min. No bleeding or LOF. +FM.   Past Medical History: History reviewed. No pertinent past medical history.  Past obstetric history: OB History  Gravida Para Term Preterm AB Living  1            SAB TAB Ectopic Multiple Live Births               # Outcome Date GA Lbr Len/2nd Weight Sex Delivery Anes PTL Lv  1 Current             Past Surgical History: History reviewed. No pertinent surgical history.  Family History: History reviewed. No pertinent family history.  Social History: Social History   Tobacco Use  . Smoking status: Never Smoker  . Smokeless tobacco: Never Used  Substance Use Topics  . Alcohol use: Not Currently  . Drug use: Not Currently    Allergies: No Known Allergies  Meds:  Medications Prior to Admission  Medication Sig Dispense Refill Last Dose  . Prenatal Vit-Fe Fumarate-FA (PRENATAL MULTIVITAMIN) TABS tablet Take 1 tablet by mouth daily at 12 noon.    07/15/2019 at Unknown time  . cephALEXin (KEFLEX) 500 MG capsule Take 500 mg by mouth 4 (four) times daily.     . metroNIDAZOLE (FLAGYL) 500 MG tablet Take 1 tablet (500 mg total) by mouth 2 (two) times daily. 14 tablet 0     I have reviewed patient's Past Medical Hx, Surgical Hx, Family Hx, Social Hx, medications and allergies.   ROS:  Review of Systems  Constitutional: Negative for fever.  Gastrointestinal: Positive for abdominal pain. Negative for constipation, diarrhea, nausea and vomiting.  Genitourinary: Negative for dysuria and frequency.  Musculoskeletal: Negative for myalgias.   Other systems negative  Physical Exam   Patient Vitals for the past 24 hrs:  BP Temp Temp src Pulse Resp SpO2  07/16/19 0034 113/76 97.8 F (36.6 C) Oral 93 18 -  07/16/19 0014 (!) 118/96 98.1 F (36.7 C) - 84 20 100 %   Constitutional: Well-developed, well-nourished female in no acute distress.  Cardiovascular: normal rate and rhythm Respiratory: normal effort, clear to auscultation bilaterally GI: Abd soft, non-tender, gravid appropriate for gestational age.   No rebound or guarding. MS: Extremities nontender, no edema, normal ROM Neurologic: Alert and oriented x 4.  GU: Neg CVAT.  PELVIC EXAM: copious white frothy discharge  FHT:  Baseline 130 , moderate variability, accelerations present, no decelerations Contractions: q 2 mins Irregular  Rare   Labs: Results for orders placed or performed during the hospital encounter  of 07/16/19 (from the past 24 hour(s))  Fetal fibronectin     Status: None   Collection Time: 07/16/19  1:02 AM  Result Value Ref Range   Fetal Fibronectin NEGATIVE NEGATIVE  Wet prep, genital     Status: Abnormal   Collection Time: 07/16/19  1:02 AM  Result Value Ref Range   Yeast Wet Prep HPF POC NONE SEEN NONE SEEN   Trich, Wet Prep PRESENT (A) NONE SEEN   Clue Cells Wet Prep HPF POC NONE SEEN NONE SEEN   WBC, Wet Prep HPF POC MANY (A) NONE SEEN   Sperm NONE  SEEN    O/Positive/-- (01/07 1551)  Imaging:    MAU Course/MDM: I have ordered Fetal Fibronectin and Wet Prep >> FFn is Negative and Wet Prep shows Trichomonas Discussed Trich may be causing the Uterine irritability, and she states that she has not taken any of the Flagyl she was prescribed.  I will give her a bolus dose of Flagyl 2gm now.   NST reviewed, reactive  Treatments in MAU included Procardia Series, Flagyl, and Terbutaline Contractions stopped after Terbutaline WIll finish the LR bolus and discharge home.  Assessment: Single intrauterine pregnancy at [redacted]w[redacted]d Uterine irritability vs Preterm contractions (negative FFn, probably caused by Trichomonas)  Plan: Discharge home Preterm Labor precautions and fetal kick counts Return for second B-Meth Tues night or Wed morning. Follow up in Office for prenatal visits   Encouraged to return here or to other Urgent Care/ED if she develops worsening of symptoms, increase in pain, fever, or other concerning symptoms.   Pt stable at time of discharge.  Wynelle Bourgeois CNM, MSN Certified Nurse-Midwife 07/16/2019 12:56 AM

## 2019-07-16 NOTE — ED Provider Notes (Signed)
MSE was initiated and I personally evaluated the patient and placed orders (if any) at  12:15 AM on Jul 16, 2019.  The patient appears stable so that the remainder of the MSE may be completed by another provider.  Patient [redacted] weeks gestation with contractions over the last hour, no bleeding or bloody show, water is not broken.  On exam, she is currently having a uterine contraction but appears otherwise comfortable.  She will be sent directly to maternal admissions unit.   Dione Booze, MD 07/16/19 [phone]

## 2019-07-16 NOTE — MAU Note (Signed)
Pt reports to MAU from [facility] stating she is having ctx every 5-10 min. No bleeding or LOF. +FM.

## 2019-07-17 ENCOUNTER — Inpatient Hospital Stay (HOSPITAL_COMMUNITY)
Admission: AD | Admit: 2019-07-17 | Discharge: 2019-07-17 | Disposition: A | Discharge status: Home / Self Care | Payer: Medicaid Other | Attending: Obstetrics and Gynecology | Admitting: Obstetrics and Gynecology

## 2019-07-17 ENCOUNTER — Other Ambulatory Visit: Payer: Self-pay

## 2019-07-17 DIAGNOSIS — O26893 Other specified pregnancy related conditions, third trimester: Secondary | ICD-10-CM | POA: Insufficient documentation

## 2019-07-17 DIAGNOSIS — Z3A33 33 weeks gestation of pregnancy: Secondary | ICD-10-CM | POA: Diagnosis not present

## 2019-07-17 DIAGNOSIS — Z3403 Encounter for supervision of normal first pregnancy, third trimester: Secondary | ICD-10-CM

## 2019-07-17 LAB — CERVICOVAGINAL ANCILLARY ONLY
Bacterial Vaginitis (gardnerella): NEGATIVE
Candida Glabrata: POSITIVE — AB
Candida Vaginitis: NEGATIVE
Chlamydia: NEGATIVE
Comment: NEGATIVE
Comment: NEGATIVE
Comment: NEGATIVE
Comment: NEGATIVE
Comment: NEGATIVE
Comment: NORMAL
Neisseria Gonorrhea: NEGATIVE
Trichomonas: POSITIVE — AB

## 2019-07-17 MED ORDER — BETAMETHASONE SOD PHOS & ACET 6 (3-3) MG/ML IJ SUSP
12.0000 mg | Freq: Once | INTRAMUSCULAR | Status: AC
  Administered 2019-07-17: 12 mg via INTRAMUSCULAR

## 2019-07-17 NOTE — MAU Note (Signed)
No complaints today.  Denies pain.  No problems with first dose.  Here for 2nd dose Betamethasone.

## 2019-07-18 ENCOUNTER — Encounter (HOSPITAL_COMMUNITY): Payer: Self-pay

## 2019-07-18 DIAGNOSIS — A599 Trichomoniasis, unspecified: Secondary | ICD-10-CM | POA: Insufficient documentation

## 2019-07-19 ENCOUNTER — Telehealth: Payer: Medicaid Other | Admitting: Certified Nurse Midwife

## 2019-08-09 ENCOUNTER — Other Ambulatory Visit (HOSPITAL_COMMUNITY)
Admission: RE | Admit: 2019-08-09 | Discharge: 2019-08-09 | Disposition: A | Discharge status: Home / Self Care | Payer: Medicaid Other | Source: Ambulatory Visit | Attending: Women's Health | Admitting: Women's Health

## 2019-08-09 ENCOUNTER — Ambulatory Visit (INDEPENDENT_AMBULATORY_CARE_PROVIDER_SITE_OTHER): Payer: Medicaid Other | Admitting: Women's Health

## 2019-08-09 ENCOUNTER — Other Ambulatory Visit: Payer: Self-pay

## 2019-08-09 VITALS — BP 119/79 | HR 77 | Wt 129.5 lb

## 2019-08-09 DIAGNOSIS — Z3403 Encounter for supervision of normal first pregnancy, third trimester: Secondary | ICD-10-CM | POA: Diagnosis present

## 2019-08-09 DIAGNOSIS — A599 Trichomoniasis, unspecified: Secondary | ICD-10-CM | POA: Diagnosis present

## 2019-08-09 DIAGNOSIS — O98313 Other infections with a predominantly sexual mode of transmission complicating pregnancy, third trimester: Secondary | ICD-10-CM

## 2019-08-09 DIAGNOSIS — Z3A36 36 weeks gestation of pregnancy: Secondary | ICD-10-CM

## 2019-08-09 NOTE — Progress Notes (Signed)
Patient presents for ROB. Patient complains of having a greenish vaginal discharge that started about a week ago. She state that she does not have any other symptoms. She is taking metronidazole that she started taking on the 6/11.

## 2019-08-09 NOTE — Progress Notes (Signed)
Subjective:  Betta Buice is a 24 y.o. G1P0 at [redacted]w[redacted]d being seen today for ongoing prenatal care.  She is currently monitored for the following issues for this low-risk pregnancy and has Supervision of normal first pregnancy; Chronic tension type headache; and Trichomoniasis on their problem list.  Patient reports greenish vaginal discharge.  Contractions: Irritability. Vag. Bleeding: None.  Movement: Present. Denies leaking of fluid.   The following portions of the patient's history were reviewed and updated as appropriate: allergies, current medications, past family history, past medical history, past social history, past surgical history and problem list. Problem list updated.  Objective:   Vitals:   08/09/19 0956  BP: 119/79  Pulse: 77  Weight: 129 lb 8 oz (58.7 kg)    Fetal Status: Fetal Heart Rate (bpm): 143 Fundal Height: 36 cm Movement: Present  Presentation: Undeterminable  General:  Alert, oriented and cooperative. Patient is in no acute distress.  Skin: Skin is warm and dry. No rash noted.   Cardiovascular: Normal heart rate noted  Respiratory: Normal respiratory effort, no problems with respiration noted  Abdomen: Soft, gravid, appropriate for gestational age. Pain/Pressure: Absent     Pelvic: Vag. Bleeding: None Vag D/C Character: White   Cervical exam performed Dilation: 1.5 Effacement (%): Thick Station: Ballotable  Extremities: Normal range of motion.  Edema: None  Mental Status: Normal mood and affect. Normal behavior. Normal judgment and thought content.   Urinalysis:      Assessment and Plan:  Pregnancy: G1P0 at [redacted]w[redacted]d  1. Encounter for supervision of normal first pregnancy in third trimester - Strep Gp B NAA - Cervicovaginal ancillary only( [place])  2. Trichomoniasis -TOC - Cervicovaginal ancillary only( [place])  Term labor symptoms and general obstetric precautions including but not limited to vaginal bleeding, contractions, leaking of fluid and  fetal movement were reviewed in detail with the patient. Please refer to After Visit Summary for other counseling recommendations.  Return in about 1 week (around 08/16/2019) for virtual LOB/APP OK.   Abrianna Sidman, Odie Sera, NP

## 2019-08-09 NOTE — Patient Instructions (Signed)
Maternity Assessment Unit (MAU)  The Maternity Assessment Unit (MAU) is [facility] at [facility]. The address is: [address], [address], [place], [place] [id number]. Please see map below for additional directions.    The Maternity Assessment Unit is designed to help you during your pregnancy, and for up to 6 weeks after delivery, with any pregnancy- or postpartum-related emergencies, if you think you are in labor, or if your water has broken. For example, if you experience nausea and vomiting, vaginal bleeding, severe abdominal or pelvic pain, elevated blood pressure or other problems related to your pregnancy or postpartum time, please come to the Maternity Assessment Unit for assistance.        Trichomoniasis Trichomoniasis is an STI (sexually transmitted infection) that can affect both women and men. In women, the outer area of the female genitalia (vulva) and the vagina are affected. In men, mainly the penis is affected, but the prostate and other reproductive organs can also be involved.  This condition can be treated with medicine. It often has no symptoms (is asymptomatic), especially in men. If not treated, trichomoniasis can last for months or years. What are the causes? This condition is caused by a parasite called Trichomonas vaginalis. Trichomoniasis most often spreads from person to person (is contagious) through sexual contact. What increases the risk? The following factors may make you more likely to develop this condition:  Having unprotected sex.  Having sex with a partner who has trichomoniasis.  Having multiple sexual partners.  Having had previous trichomoniasis infections or other STIs. What are the signs or symptoms? In women, symptoms of trichomoniasis include:  Abnormal vaginal discharge that is clear, white, gray, or yellow-green and foamy and has an unusual "fishy" odor.  Itching and irritation of the  vagina and vulva.  Burning or pain during urination or sex.  Redness and swelling of the genitals. In men, symptoms of trichomoniasis include:  Penile discharge that may be foamy or contain pus.  Pain in the penis. This may happen only when urinating.  Itching or irritation inside the penis.  Burning after urination or ejaculation. How is this diagnosed? In women, this condition may be found during a routine Pap test or physical exam. It may be found in men during a routine physical exam. Your health care provider may do tests to help diagnose this infection, such as:  Urine tests (men and women).  The following in women: ? Testing the pH of the vagina. ? A vaginal swab test that checks for the Trichomonas vaginalis parasite. ? Testing vaginal secretions. Your health care provider may test you for other STIs, including HIV (human immunodeficiency virus). How is this treated? This condition is treated with medicine taken by mouth (orally), such as metronidazole or tinidazole, to fight the infection. Your sexual partner(s) also need to be tested and treated.  If you are a woman and you plan to become pregnant or think you may be pregnant, tell your health care provider right away. Some medicines that are used to treat the infection should not be taken during pregnancy. Your health care provider may recommend over-the-counter medicines or creams to help relieve itching or irritation. You may be tested for infection again 3 months after treatment. Follow these instructions at home:  Take and use over-the-counter and prescription medicines, including creams, only as told by your health care provider.  Take your antibiotic medicine as told by your health care provider. Do not stop  taking the antibiotic even if you start to feel better.  Do not have sex until 7-10 days after you finish your medicine, or until your health care provider approves. Ask your health care provider when you may  start to have sex again.  (Women) Do not douche or wear tampons while you have the infection.  Discuss your infection with your sexual partner(s). Make sure that your partner gets tested and treated, if necessary.  Keep all follow-up visits as told by your health care provider. This is important. How is this prevented?   Use condoms every time you have sex. Using condoms correctly and consistently can help protect against STIs.  Avoid having multiple sexual partners.  Talk with your sexual partner about any symptoms that either of you may have, as well as any history of STIs.  Get tested for STIs and STDs (sexually transmitted diseases) before you have sex. Ask your partner to do the same.  Do not have sexual contact if you have symptoms of trichomoniasis or another STI. Contact a health care provider if:  You still have symptoms after you finish your medicine.  You develop pain in your abdomen.  You have pain when you urinate.  You have bleeding after sex.  You develop a rash.  You feel nauseous or you vomit.  You plan to become pregnant or think you may be pregnant. Summary  Trichomoniasis is an STI (sexually transmitted infection) that can affect both women and men.  This condition often has no symptoms (is asymptomatic), especially in men.  Without treatment, this condition can last for months or years.  You should not have sex until 7-10 days after you finish your medicine, or until your health care provider approves. Ask your health care provider when you may start to have sex again.  Discuss your infection with your sexual partner(s). Make sure that your partner gets tested and treated, if necessary. This information is not intended to replace advice given to you by your health care provider. Make sure you discuss any questions you have with your health care provider. Document Revised: 11/22/2017 Document Reviewed: 11/22/2017 Elsevier Patient Education  2020  [employer].        Signs and Symptoms of Labor Labor is your body's natural process of moving your baby, placenta, and umbilical cord out of your uterus. The process of labor usually starts when your baby is full-term, between 53 and 40 weeks of pregnancy. How will I know when I am close to going into labor? As your body prepares for labor and the birth of your baby, you may notice the following symptoms in the weeks and days before true labor starts:  Having a strong desire to get your home ready to receive your new baby. This is called nesting. Nesting may be a sign that labor is approaching, and it may occur several weeks before birth. Nesting may involve cleaning and organizing your home.  Passing a small amount of thick, bloody mucus out of your vagina (normal bloody show or losing your mucus plug). This may happen more than a week before labor begins, or it might occur right before labor begins as the opening of the cervix starts to widen (dilate). For some women, the entire mucus plug passes at once. For others, smaller portions of the mucus plug may gradually pass over several days.  Your baby moving (dropping) lower in your pelvis to get into position for birth (lightening). When this happens, you may feel more  pressure on your bladder and pelvic bone and less pressure on your ribs. This may make it easier to breathe. It may also cause you to need to urinate more often and have problems with bowel movements.  Having "practice contractions" (Braxton Hicks contractions) that occur at irregular (unevenly spaced) intervals that are more than 10 minutes apart. This is also called false labor. False labor contractions are common after exercise or sexual activity, and they will stop if you change position, rest, or drink fluids. These contractions are usually mild and do not get stronger over time. They may feel like: ? A backache or back pain. ? Mild cramps, similar to menstrual  cramps. ? Tightening or pressure in your abdomen. Other early symptoms that labor may be starting soon include:  Nausea or loss of appetite.  Diarrhea.  Having a sudden burst of energy, or feeling very tired.  Mood changes.  Having trouble sleeping. How will I know when labor has begun? Signs that true labor has begun may include:  Having contractions that come at regular (evenly spaced) intervals and increase in intensity. This may feel like more intense tightening or pressure in your abdomen that moves to your back. ? Contractions may also feel like rhythmic pain in your upper thighs or back that comes and goes at regular intervals. ? For first-time mothers, this change in intensity of contractions often occurs at a more gradual pace. ? Women who have given birth before may notice a more rapid progression of contraction changes.  Having a feeling of pressure in the vaginal area.  Your water breaking (rupture of membranes). This is when the sac of fluid that surrounds your baby breaks. When this happens, you will notice fluid leaking from your vagina. This may be clear or blood-tinged. Labor usually starts within 24 hours of your water breaking, but it may take longer to begin. ? Some women notice this as a gush of fluid. ? Others notice that their underwear repeatedly becomes damp. Follow these instructions at home:   When labor starts, or if your water breaks, call your health care provider or nurse care line. Based on your situation, they will determine when you should go in for an exam.  When you are in early labor, you may be able to rest and manage symptoms at home. Some strategies to try at home include: ? Breathing and relaxation techniques. ? Taking a warm bath or shower. ? Listening to music. ? Using a heating pad on the lower back for pain. If you are directed to use heat:  Place a towel between your skin and the heat source.  Leave the heat on for 20-30  minutes.  Remove the heat if your skin turns bright red. This is especially important if you are unable to feel pain, heat, or cold. You may have a greater risk of getting burned. Get help right away if:  You have painful, regular contractions that are 5 minutes apart or less.  Labor starts before you are [redacted] weeks along in your pregnancy.  You have a fever.  You have a headache that does not go away.  You have bright red blood coming from your vagina.  You do not feel your baby moving.  You have a sudden onset of: ? Severe headache with vision problems. ? Nausea, vomiting, or diarrhea. ? Chest pain or shortness of breath. These symptoms may be an emergency. If your health care provider recommends that you go to the hospital or  birth center where you plan to deliver, do not drive yourself. Have someone else drive you, or call emergency services (911 in the [place] Summary  Labor is your body's natural process of moving your baby, placenta, and umbilical cord out of your uterus.  The process of labor usually starts when your baby is full-term, between 46 and 40 weeks of pregnancy.  When labor starts, or if your water breaks, call your health care provider or nurse care line. Based on your situation, they will determine when you should go in for an exam. This information is not intended to replace advice given to you by your health care provider. Make sure you discuss any questions you have with your health care provider. Document Revised: 11/08/2016 Document Reviewed: 07/16/2016 Elsevier Patient Education  [employer].        Group B Streptococcus Test During Pregnancy Why am I having this test? Routine testing, also called screening, for group B streptococcus (GBS) is recommended for all pregnant women between the 36th and 37th week of pregnancy. GBS is a type of bacteria that can be passed from mother to baby during childbirth. Screening will help guide whether or not  you will need treatment during labor and delivery to prevent complications such as:  An infection in your uterus during labor.  An infection in your uterus after delivery.  A serious infection in your baby after delivery, such as pneumonia, meningitis, or sepsis. GBS screening is not often done before 36 weeks of pregnancy unless you go into labor prematurely. What happens if I have group B streptococcus? If testing shows that you have GBS, your health care provider will recommend treatment with IV antibiotics during labor and delivery. This treatment significantly decreases the risk of complications for you and your baby. If you have a planned C-section and you have GBS, you may not need to be treated with antibiotics because GBS is usually passed to babies after labor starts and your water breaks. If you are in labor or your water breaks before your C-section, it is possible for GBS to get into your uterus and be passed to your baby, so you might need treatment. Is there a chance I may not need to be tested? You may not need to be tested for GBS if:  You have a urine test that shows GBS before 36 to 37 weeks.  You had a baby with GBS infection after a previous delivery. In these cases, you will automatically be treated for GBS during labor and delivery. What is being tested? This test is done to check if you have group B streptococcus in your vagina or rectum. What kind of sample is taken? To collect samples for this test, your health care provider will swab your vagina and rectum with a cotton swab. The sample is then sent to the lab to see if GBS is present. What happens during the test?   You will remove your clothing from the waist down.  You will lie down on an exam table in the same position as you would for a pelvic exam.  Your health care provider will swab your vagina and rectum to collect samples for a culture test.  You will be able to go home after the test and do all  your usual activities. How are the results reported? The test results are reported as positive or negative. What do the results mean?  A positive test means you are at risk for passing GBS to  your baby during labor and delivery. Your health care provider will recommend that you are treated with an IV antibiotic during labor and delivery.  A negative test means you are at very low risk of passing GBS to your baby. There is still a low risk of passing GBS to your baby because sometimes test results may report that you do not have a condition when you do (false-negative result) or there is a chance that you may become infected with GBS after the test is done. You most likely will not need to be treated with an antibiotic during labor and delivery. Talk with your health care provider about what your results mean. Questions to ask your health care provider Ask your health care provider, or the department that is doing the test:  When will my results be ready?  How will I get my results?  What are my treatment options? Summary  Routine testing (screening) for group B streptococcus (GBS) is recommended for all pregnant women between the 36th and 37th week of pregnancy.  GBS is a type of bacteria that can be passed from mother to baby during childbirth.  If testing shows that you have GBS, your health care provider will recommend that you are treated with IV antibiotics during labor and delivery. This treatment almost always prevents infection in newborns. This information is not intended to replace advice given to you by your health care provider. Make sure you discuss any questions you have with your health care provider. Document Revised: 06/01/2018 Document Reviewed: 03/08/2018 Elsevier Patient Education  2020 [employer].

## 2019-08-10 LAB — CERVICOVAGINAL ANCILLARY ONLY
Bacterial Vaginitis (gardnerella): NEGATIVE
Candida Glabrata: POSITIVE — AB
Candida Vaginitis: NEGATIVE
Chlamydia: NEGATIVE
Comment: NEGATIVE
Comment: NEGATIVE
Comment: NEGATIVE
Comment: NEGATIVE
Comment: NEGATIVE
Comment: NORMAL
Neisseria Gonorrhea: NEGATIVE
Trichomonas: NEGATIVE

## 2019-08-11 ENCOUNTER — Inpatient Hospital Stay (HOSPITAL_COMMUNITY)
Admit: 2019-08-11 | Discharge: 2019-08-13 | DRG: 807 | Disposition: A | Discharge status: Home / Self Care | Payer: Medicaid Other | Attending: Obstetrics and Gynecology | Admitting: Obstetrics and Gynecology

## 2019-08-11 DIAGNOSIS — Z3A37 37 weeks gestation of pregnancy: Secondary | ICD-10-CM

## 2019-08-11 DIAGNOSIS — Z3400 Encounter for supervision of normal first pregnancy, unspecified trimester: Secondary | ICD-10-CM

## 2019-08-11 DIAGNOSIS — G44229 Chronic tension-type headache, not intractable: Secondary | ICD-10-CM | POA: Diagnosis present

## 2019-08-11 DIAGNOSIS — A599 Trichomoniasis, unspecified: Secondary | ICD-10-CM | POA: Diagnosis present

## 2019-08-11 DIAGNOSIS — Z20822 Contact with and (suspected) exposure to covid-19: Secondary | ICD-10-CM | POA: Diagnosis present

## 2019-08-11 LAB — STREP GP B NAA: Strep Gp B NAA: NEGATIVE

## 2019-08-12 ENCOUNTER — Other Ambulatory Visit: Payer: Self-pay

## 2019-08-12 ENCOUNTER — Encounter (HOSPITAL_COMMUNITY): Payer: Self-pay | Admitting: Obstetrics and Gynecology

## 2019-08-12 DIAGNOSIS — Z3A37 37 weeks gestation of pregnancy: Secondary | ICD-10-CM | POA: Diagnosis not present

## 2019-08-12 DIAGNOSIS — O26893 Other specified pregnancy related conditions, third trimester: Secondary | ICD-10-CM | POA: Diagnosis present

## 2019-08-12 DIAGNOSIS — Z20822 Contact with and (suspected) exposure to covid-19: Secondary | ICD-10-CM | POA: Diagnosis present

## 2019-08-12 LAB — CBC
HCT: 39.3 % (ref 36.0–46.0)
Hemoglobin: 13 g/dL (ref 12.0–15.0)
MCH: 30.7 pg (ref 26.0–34.0)
MCHC: 33.1 g/dL (ref 30.0–36.0)
MCV: 92.9 fL (ref 80.0–100.0)
Platelets: 255 10*3/uL (ref 150–400)
RBC: 4.23 MIL/uL (ref 3.87–5.11)
RDW: 13.3 % (ref 11.5–15.5)
WBC: 6.6 10*3/uL (ref 4.0–10.5)
nRBC: 0 % (ref 0.0–0.2)

## 2019-08-12 LAB — SARS CORONAVIRUS 2 BY RT PCR (HOSPITAL ORDER, PERFORMED IN ~~LOC~~ HOSPITAL LAB): SARS Coronavirus 2: NEGATIVE

## 2019-08-12 LAB — TYPE AND SCREEN
ABO/RH(D): O POS
Antibody Screen: NEGATIVE

## 2019-08-12 LAB — RPR: RPR Ser Ql: NONREACTIVE

## 2019-08-12 LAB — ABO/RH: ABO/RH(D): O POS

## 2019-08-12 LAB — POCT FERN TEST: POCT Fern Test: NEGATIVE

## 2019-08-12 MED ORDER — IBUPROFEN 600 MG PO TABS
600.0000 mg | ORAL_TABLET | Freq: Four times a day (QID) | ORAL | Status: DC
  Administered 2019-08-12 – 2019-08-13 (×5): 600 mg via ORAL
  Filled 2019-08-12 (×5): qty 1

## 2019-08-12 MED ORDER — DIBUCAINE (PERIANAL) 1 % EX OINT: 1.0000 "application " | TOPICAL_OINTMENT | CUTANEOUS | Status: DC | PRN

## 2019-08-12 MED ORDER — ONDANSETRON HCL 4 MG PO TABS: 4.0000 mg | ORAL_TABLET | ORAL | Status: DC | PRN

## 2019-08-12 MED ORDER — ACETAMINOPHEN 325 MG PO TABS: 650.0000 mg | ORAL_TABLET | ORAL | Status: DC | PRN

## 2019-08-12 MED ORDER — ONDANSETRON HCL 4 MG/2ML IJ SOLN: 4.0000 mg | Freq: Four times a day (QID) | INTRAMUSCULAR | Status: DC | PRN

## 2019-08-12 MED ORDER — SENNOSIDES-DOCUSATE SODIUM 8.6-50 MG PO TABS
2.0000 | ORAL_TABLET | ORAL | Status: DC
  Administered 2019-08-13: 2 via ORAL
  Filled 2019-08-12 (×2): qty 2

## 2019-08-12 MED ORDER — OXYTOCIN-SODIUM CHLORIDE 30-0.9 UT/500ML-% IV SOLN
INTRAVENOUS | Status: AC
  Administered 2019-08-12: 333 mL via INTRAVENOUS
  Filled 2019-08-12: qty 500

## 2019-08-12 MED ORDER — WITCH HAZEL-GLYCERIN EX PADS: 1.0000 "application " | MEDICATED_PAD | CUTANEOUS | Status: DC | PRN

## 2019-08-12 MED ORDER — LIDOCAINE HCL (PF) 1 % IJ SOLN
30.0000 mL | INTRAMUSCULAR | Status: AC | PRN
  Administered 2019-08-12: 30 mL via SUBCUTANEOUS
  Filled 2019-08-12: qty 30

## 2019-08-12 MED ORDER — COCONUT OIL OIL: 1.0000 "application " | TOPICAL_OIL | Status: DC | PRN

## 2019-08-12 MED ORDER — OXYTOCIN BOLUS FROM INFUSION: 333.0000 mL | Freq: Once | INTRAVENOUS | Status: AC

## 2019-08-12 MED ORDER — BENZOCAINE-MENTHOL 20-0.5 % EX AERO
1.0000 "application " | INHALATION_SPRAY | CUTANEOUS | Status: DC | PRN
  Filled 2019-08-12: qty 56

## 2019-08-12 MED ORDER — LACTATED RINGERS IV SOLN
INTRAVENOUS | Status: DC
Start: 2019-08-12 — End: 2019-08-12

## 2019-08-12 MED ORDER — PRENATAL MULTIVITAMIN CH
1.0000 | ORAL_TABLET | Freq: Every day | ORAL | Status: DC
  Administered 2019-08-12: 1 via ORAL
  Filled 2019-08-12: qty 1

## 2019-08-12 MED ORDER — OXYCODONE-ACETAMINOPHEN 5-325 MG PO TABS: 2.0000 | ORAL_TABLET | ORAL | Status: DC | PRN

## 2019-08-12 MED ORDER — OXYTOCIN-SODIUM CHLORIDE 30-0.9 UT/500ML-% IV SOLN
2.5000 [IU]/h | INTRAVENOUS | Status: DC
  Administered 2019-08-12: 2.5 [IU]/h via INTRAVENOUS

## 2019-08-12 MED ORDER — OXYCODONE HCL 5 MG PO TABS
5.0000 mg | ORAL_TABLET | ORAL | Status: DC | PRN
  Administered 2019-08-12: 5 mg via ORAL
  Filled 2019-08-12: qty 1

## 2019-08-12 MED ORDER — FENTANYL CITRATE (PF) 100 MCG/2ML IJ SOLN
100.0000 ug | Freq: Once | INTRAMUSCULAR | Status: AC
  Administered 2019-08-12: 100 ug via INTRAVENOUS
  Filled 2019-08-12: qty 2

## 2019-08-12 MED ORDER — ACETAMINOPHEN 325 MG PO TABS
650.0000 mg | ORAL_TABLET | ORAL | Status: DC | PRN
  Administered 2019-08-12 (×3): 650 mg via ORAL
  Filled 2019-08-12 (×3): qty 2

## 2019-08-12 MED ORDER — LACTATED RINGERS IV SOLN: INTRAVENOUS | Status: DC

## 2019-08-12 MED ORDER — OXYCODONE-ACETAMINOPHEN 5-325 MG PO TABS: 1.0000 | ORAL_TABLET | ORAL | Status: DC | PRN

## 2019-08-12 MED ORDER — LACTATED RINGERS IV SOLN: 500.0000 mL | INTRAVENOUS | Status: DC | PRN

## 2019-08-12 MED ORDER — SIMETHICONE 80 MG PO CHEW: 80.0000 mg | CHEWABLE_TABLET | ORAL | Status: DC | PRN

## 2019-08-12 MED ORDER — SOD CITRATE-CITRIC ACID 500-334 MG/5ML PO SOLN: 30.0000 mL | ORAL | Status: DC | PRN

## 2019-08-12 MED ORDER — FENTANYL CITRATE (PF) 100 MCG/2ML IJ SOLN
100.0000 ug | Freq: Once | INTRAMUSCULAR | Status: AC
  Administered 2019-08-12: 100 ug via INTRAVENOUS

## 2019-08-12 MED ORDER — DIPHENHYDRAMINE HCL 25 MG PO CAPS: 25.0000 mg | ORAL_CAPSULE | Freq: Four times a day (QID) | ORAL | Status: DC | PRN

## 2019-08-12 MED ORDER — FENTANYL CITRATE (PF) 100 MCG/2ML IJ SOLN
INTRAMUSCULAR | Status: AC
  Filled 2019-08-12: qty 2

## 2019-08-12 MED ORDER — TETANUS-DIPHTH-ACELL PERTUSSIS 5-2.5-18.5 LF-MCG/0.5 IM SUSP: 0.5000 mL | Freq: Once | INTRAMUSCULAR | Status: DC

## 2019-08-12 MED ORDER — ONDANSETRON HCL 4 MG/2ML IJ SOLN: 4.0000 mg | INTRAMUSCULAR | Status: DC | PRN

## 2019-08-12 NOTE — Plan of Care (Signed)
  Problem: Education: Goal: Knowledge of condition will improve Outcome: Completed/Met

## 2019-08-12 NOTE — H&P (Addendum)
OBSTETRIC ADMISSION HISTORY AND PHYSICAL  Katherine Schmidt is a 24 y.o. female G1P0 with IUP at [redacted]w[redacted]d by LMP presenting for SOL and LOF. She reports +FMs, +LOF, no VB, no blurry vision, headaches or peripheral edema, and RUQ pain.  She plans on bottle feeding. She requests no method for birth control. She received her prenatal care at [facility]: By LMP --->  Estimated Date of Delivery: 09/01/19  Sono:   @[redacted]w[redacted]d , CWD, normal anatomy, cephalic presentation, anterior placenta, 1048g, 73% EFW   Prenatal History/Complications: -Chronic Tension HA -Trichomoniasis s/p neg TOC  Past Medical History: History reviewed. No pertinent past medical history.  Past Surgical History: History reviewed. No pertinent surgical history.  Obstetrical History: OB History    Gravida  1   Para  1   Term  1   Preterm      AB      Living  1     SAB      TAB      Ectopic      Multiple  0   Live Births  1           Social History: Social History   Socioeconomic History  . Marital status: Single    Spouse name: Not on file  . Number of children: Not on file  . Years of education: Not on file  . Highest education level: Not on file  Occupational History  . Not on file  Tobacco Use  . Smoking status: Never Smoker  . Smokeless tobacco: Never Used  Vaping Use  . Vaping Use: Never used  Substance and Sexual Activity  . Alcohol use: Not Currently  . Drug use: Not Currently  . Sexual activity: Yes    Birth control/protection: None  Other Topics Concern  . Not on file  Social History Narrative  . Not on file   Social Determinants of Health   Financial Resource Strain:   . Difficulty of Paying Living Expenses:   Food Insecurity:   . Worried About Charity fundraiser in the Last Year:   . Arboriculturist in the Last Year:   Transportation Needs:   . Film/video editor (Medical):   Marland Kitchen Lack of Transportation (Non-Medical):   Physical Activity:   . Days of Exercise per  Week:   . Minutes of Exercise per Session:   Stress:   . Feeling of Stress :   Social Connections:   . Frequency of Communication with Friends and Family:   . Frequency of Social Gatherings with Friends and Family:   . Attends Religious Services:   . Active Member of Clubs or Organizations:   . Attends Archivist Meetings:   Marland Kitchen Marital Status:     Family History: History reviewed. No pertinent family history.  Allergies: No Known Allergies  Medications Prior to Admission  Medication Sig Dispense Refill Last Dose  . metroNIDAZOLE (FLAGYL) 500 MG tablet Take 1 tablet (500 mg total) by mouth 2 (two) times daily. 14 tablet 0 08/11/2019 at Unknown time  . Prenatal Vit-Fe Fumarate-FA (PRENATAL MULTIVITAMIN) TABS tablet Take 1 tablet by mouth daily at 12 noon.   08/11/2019 at Unknown time     Review of Systems   All systems reviewed and negative except as stated in HPI  Blood pressure 126/79, pulse 75, temperature 98.6 F (37 C), temperature source Oral, resp. rate 18, weight 57.9 kg, last menstrual period 11/25/2018, unknown if currently breastfeeding. General appearance: alert,  cooperative, appears stated age and moderate distress Lungs: normal effort Heart: regular rate  Abdomen: soft, non-tender; bowel sounds normal Pelvic: gravid uterus GU: No vaginal lesions  Extremities: no LE edema  Presentation: cephalic Fetal monitoringBaseline: 130 bpm, Variability: Good {> 6 bpm), Accelerations: Reactive and Decelerations: Absent Uterine activity: q3 Dilation: 10 Effacement (%): 100 Station: Plus 2 Exam by:: Sunday Spillers  Prenatal labs: ABO, Rh: --/--/O POS, O POS Performed at [facility], [address], [place], [place] [address]  [phone]/20 0118) Antibody: NEG (06/20 0118) Rubella: 2.81 (01/07 1551) RPR: Non Reactive (04/07 0857)  HBsAg: Negative (01/07 1551)  HIV: Non Reactive (04/07 0857)  GBS: Negative/-- (06/17 1028)  2 hr Glucola normal  Genetic  screening  LR female, AFP not completed  Anatomy US: normal   Prenatal Transfer Tool  Maternal Diabetes: No Genetic Screening: Normal Maternal Ultrasounds/Referrals: Normal Fetal Ultrasounds or other Referrals:  None Maternal Substance Abuse:  No Significant Maternal Medications:  None Significant Maternal Lab Results: Group B Strep negative  Results for orders placed or performed during the hospital encounter of 08/11/19 (from the past 24 hour(s))  SARS Coronavirus 2 by RT PCR (hospital order, performed in [facility] hospital lab) Nasopharyngeal Nasopharyngeal Swab   Collection Time: 08/12/19  1:17 AM   Specimen: Nasopharyngeal Swab  Result Value Ref Range   SARS Coronavirus 2 NEGATIVE NEGATIVE  Type and screen [facility]   Collection Time: 08/12/19  1:18 AM  Result Value Ref Range   ABO/RH(D) O POS    Antibody Screen NEG    Sample Expiration      08/15/2019,2359 Performed at [facility], [address] [place], [place] [address]   ABO/Rh   Collection Time: 08/12/19  1:18 AM  Result Value Ref Range   ABO/RH(D)      O POS Performed at [facility], [address] [place], [place] [address]   POCT fern test   Collection Time: 08/12/19  1:30 AM  Result Value Ref Range   POCT Fern Test Negative = intact amniotic membranes   CBC   Collection Time: 08/12/19  1:49 AM  Result Value Ref Range   WBC 6.6 4.0 - 10.5 K/uL   RBC 4.23 3.87 - 5.11 MIL/uL   Hemoglobin 13.0 12.0 - 15.0 g/dL   HCT 54.0 36 - 46 %   MCV 92.9 80.0 - 100.0 fL   MCH 30.7 26.0 - 34.0 pg   MCHC 33.1 30.0 - 36.0 g/dL   RDW 08.6 76.1 - 95.0 %   Platelets 255 150 - 400 K/uL   nRBC 0.0 0.0 - 0.2 %    Patient Active Problem List   Diagnosis Date Noted  . Indication for care in labor and delivery, antepartum 08/12/2019  . Trichomoniasis 07/18/2019  . Chronic tension type headache 03/01/2019  . Supervision of normal first pregnancy 02/22/2019    Assessment/Plan:  Katherine  Schmidt is a 24 y.o. G1P0 at [redacted]w[redacted]d here for SOL.   #Labor:expectant management, anticipate vaginal delivery soon after admission  #Pain: IV pain medications  #FWB: Category I #ID:  GBS negative  #MOF: bottle  #MOC: declines #Circ:  Yes   Nicki Guadalajara, MD Family Medicine, PGY-1 08/12/2019, 5:19 AM    OB FELLOW ATTESTATION  I have seen and examined this patient and edited the above documentation in the resident's note to reflect any changes or updates.  Zack Seal, MD/[facility] OB Fellow  08/12/2019, 5:22 AM

## 2019-08-12 NOTE — Discharge Summary (Addendum)
Postpartum Discharge Summary     Patient Name: Katherine Schmidt DOB: 1995-03-20 MRN: [id number]  Date of admission: 08/11/2019 Delivery date:08/12/2019  Delivering provider: Stark Klein  Date of discharge: 08/13/2019  Admitting diagnosis: Indication for care in labor and delivery, antepartum [O75.9] Intrauterine pregnancy: [redacted]w[redacted]d    Secondary diagnosis:  Active Problems:   Supervision of normal first pregnancy   Chronic tension type headache   Trichomoniasis   Indication for care in labor and delivery, antepartum  Additional problems: None    Discharge diagnosis: Term Pregnancy Delivered                                              Post partum procedures:None Augmentation: N/A Complications: None  Hospital course: Onset of Labor With Vaginal Delivery      24y.o. yo G1P1001 at 360w1das admitted in Active Labor on 08/11/2019. Patient had an uncomplicated labor course as follows: arrived at 3cm for labor check and rapidly progressed to 9cm while in the MAU, transferred to L&D and shortly after had uncomplicated NSVD. Membrane Rupture Time/Date: 1:45 AM ,08/12/2019   Delivery Method:Vaginal, Spontaneous  Episiotomy: None  Lacerations:  1st degree  Patient had an uncomplicated postpartum course.  She is ambulating, tolerating a regular diet, passing flatus, and urinating well. Patient is discharged home in stable condition on 08/13/19.  Newborn Data: Birth date:08/12/2019  Birth time:2:13 AM  Gender:Female  Living status:Living  Apgars:9 ,9  Weight:2934 g   Magnesium Sulfate received: No BMZ received: No Rhophylac:N/A MMR:N/A T-DaP:Given prenatally Flu: No Transfusion:No  Physical exam  Vitals:   08/12/19 0246 08/12/19 0300 08/12/19 0315 08/12/19 0331  BP: 127/84 120/89 123/76 121/69  Pulse: 71 (!) 105 67 66  Resp:      Temp:      TempSrc:      Weight:       General: alert, cooperative and no distress Lochia: appropriate Uterine Fundus: firm DVT Evaluation: No  evidence of DVT seen on physical exam. Labs: Lab Results  Component Value Date   WBC 6.6 08/12/2019   HGB 13.0 08/12/2019   HCT 39.3 08/12/2019   MCV 92.9 08/12/2019   PLT 255 08/12/2019   No flowsheet data found. Edinburgh Score: No flowsheet data found.   After visit meds:  Allergies as of 08/13/2019   No Known Allergies     Medication List    STOP taking these medications   metroNIDAZOLE 500 MG tablet Commonly known as: Flagyl     TAKE these medications   ibuprofen 600 MG tablet Commonly known as: ADVIL Take 1 tablet (600 mg total) by mouth every 6 (six) hours.   prenatal multivitamin Tabs tablet Take 1 tablet by mouth daily at 12 noon.        Discharge home in stable condition Infant Feeding: Bottle Infant Disposition:home with mother Discharge instruction: per After Visit Summary and Postpartum booklet. Activity: Advance as tolerated. Pelvic rest for 6 weeks.  Diet: routine diet Future Appointments: Future Appointments  Date Time Provider [place]6/28/2021  1:30 PM Constant, PeVickii ChafeMD [place]   Follow up Visit:   Please schedule this patient for a In person postpartum visit in 6 weeks with the following provider: Any provider. Additional Postpartum F/U:n/a  Low risk pregnancy complicated by: n/a Delivery mode:  Vaginal, Spontaneous  Anticipated Birth Control:  Unsure  EMILY Madelin Headings, MD PGY-2 Resident Family Medicine 08/13/2019, 12:10 PM  Midwife attestation I have seen and examined this patient and agree with above documentation in the resident's note.   Katherine Schmidt is a 24 y.o. G1P1001 s/p SVD.  Pain is well controlled. Plan for birth control is undecided. Method of Feeding: bottle  PE:  Gen: well appearing Heart: reg rate Lungs: normal WOB Fundus firm Ext: no pain, no edema  Recent Labs    08/12/19 0149  HGB 13.0  HCT 39.3    Assessment S/p SVD PPD # 1  Plan: - discharge home - postpartum care  discussed - f/u in office in 6 weeks for postpartum visit   Julianne Handler, CNM 1:29 PM

## 2019-08-12 NOTE — MAU Provider Note (Signed)
  S: Ms. Katherine Schmidt is a 24 y.o. G1P0 at [redacted]w[redacted]d  who presents to MAU today complaining of leaking of fluid since 1030pm.  She denies vaginal bleeding. She endorses contractions. She reports normal fetal movement.    O: BP 129/73   Pulse (!) 58   Temp 98 F (36.7 C) (Oral)   Resp 18   Wt 57.9 kg   LMP 11/25/2018   BMI 26.67 kg/m  GENERAL: Well-developed, well-nourished female in no acute distress.  HEAD: Normocephalic, atraumatic.  CHEST: Normal effort of breathing, regular heart rate ABDOMEN: Soft, nontender, gravid PELVIC: deferred d/t patients discomfort   Cervical exam:   Dilation: 3 Effacement (%): 100 Exam by:: Venia Carbon, NP   Fetal Monitoring: Baseline: 130 bpm Variability: Moderate Accelerations: 15x15 Decelerations: None Contractions: Q3  Fentanyl 100 mcg IV given in MAU.  RN rechecked patient and she was found to be complete; 10 cm.   Results for orders placed or performed during the hospital encounter of 08/11/19 (from the past 24 hour(s))  POCT fern test     Status: Normal   Collection Time: 08/12/19  1:30 AM  Result Value Ref Range   POCT Fern Test Negative = intact amniotic membranes      A: SIUP at [redacted]w[redacted]d  Active labor   P:  Admit to labor GBS negative  Dr. Crissie Reese aware of patient admission   Devarious Pavek, Harolyn Rutherford, NP 08/12/2019 1:47 AM

## 2019-08-12 NOTE — MAU Note (Signed)
Patient reports to MAU for ROM and contractions. She stated her water broke at 1030pm clear fluid, +movement. Some VB

## 2019-08-13 MED ORDER — IBUPROFEN 600 MG PO TABS: 600.0000 mg | ORAL_TABLET | Freq: Four times a day (QID) | ORAL | 0 refills | Status: AC

## 2019-08-13 NOTE — Progress Notes (Addendum)
Post Partum Day 1 s/p SVD. Subjective: no complaints, up ad lib, voiding, tolerating PO, + flatus and pain well-controlled.  Objective: Blood pressure 100/62, pulse 77, temperature 98.1 F (36.7 C), temperature source Oral, resp. rate 16, weight 57.9 kg, last menstrual period 11/25/2018, SpO2 100 %, unknown if currently breastfeeding.  Physical Exam:  General: alert, cooperative and no distress Lochia: appropriate Uterine Fundus: firm DVT Evaluation: No evidence of DVT seen on physical exam.  Recent Labs    08/12/19 0149  HGB 13.0  HCT 39.3    Assessment/Plan: Possible discharge later today if baby able, nurse to call for orders.  Circumcision prior to discharge.  Undecided on contraception, wants to wait and discuss further at postpartum visit.     LOS: 1 day   Katherine Genene Churn, MD PGY-2 Resident Family Medicine 08/13/2019, 9:59 AM  Midwife attestation I have seen and examined this patient and agree with above documentation in the resident's note.   Post Partum Day 1  Katherine Schmidt is a 24 y.o. G1P1001 s/p SVD.  Pt denies problems with ambulating, voiding or po intake. Pain is well controlled. Method of Feeding: bottle  PE:  Gen: well appearing Heart: reg rate Lungs: normal WOB Fundus firm Ext: soft, no pain, no edema  Assessment: S/p SVD PPD #1  Plan for discharge: later today pending baby discharge or tomorrow  Donette Larry, CNM 10:41 AM

## 2019-08-20 ENCOUNTER — Telehealth: Payer: Medicaid Other | Admitting: Obstetrics and Gynecology

## 2019-09-12 ENCOUNTER — Ambulatory Visit (HOSPITAL_BASED_OUTPATIENT_CLINIC_OR_DEPARTMENT_OTHER): Payer: Medicaid Other | Admitting: Obstetrics and Gynecology

## 2019-09-12 ENCOUNTER — Encounter: Payer: Self-pay | Admitting: Obstetrics and Gynecology

## 2019-09-12 ENCOUNTER — Other Ambulatory Visit: Payer: Self-pay

## 2019-09-12 NOTE — Progress Notes (Signed)
..     Post Partum Visit Note  Katherine Schmidt is a 24 y.o. G52P1001 female who presents for a postpartum visit. She is 4 weeks postpartum following a normal spontaneous vaginal delivery.  I have fully reviewed the prenatal and intrapartum course. The delivery was at [redacted]w[redacted]d gestational weeks.  Anesthesia: none. Postpartum course has been well. Baby is doing well. Baby is feeding by bottle - [employer] Good Start. Bleeding red. Bowel function is normal. Bladder function is normal. Patient is not sexually active. Contraception method is abstinence. Postpartum depression screening: negative.       Review of Systems Pertinent items noted in HPI and remainder of comprehensive ROS otherwise negative.    Objective:  Blood pressure 133/88, pulse 77, weight 112 lb (50.8 kg), unknown if currently breastfeeding.  General:  alert, cooperative and no distress   Breasts:  inspection negative, no nipple discharge or bleeding, no masses or nodularity palpable  Lungs: clear to auscultation bilaterally  Heart:  regular rate and rhythm  Abdomen: soft, non-tender; bowel sounds normal; no masses,  no organomegaly   Vulva:  normal  Vagina: normal vagina, no discharge, exudate, lesion, or erythema  Cervix:  multiparous appearance  Corpus: normal size, contour, position, consistency, mobility, non-tender  Adnexa:  no mass, fullness, tenderness  Rectal Exam: Not performed.        Assessment:    Normal postpartum exam. Pap smear not done at today's visit.   Plan:   Essential components of care per ACOG recommendations:  1.  Mood and well being: Patient with negative depression screening today.  - Patient does not use tobacco.   2. Infant care and feeding:  -Patient currently breastmilk feeding? No  -Social determinants of health (SDOH) reviewed in EPIC. No concerns  3. Sexuality, contraception and birth spacing - Patient does not want a pregnancy in the next year.  Desired family size is 1 child.  -  Reviewed forms of contraception in tiered fashion. Patient desired abstinence today.   - Discussed birth spacing of 18 months  4. Sleep and fatigue -Encouraged family/partner/community support of 4 hrs of uninterrupted sleep to help with mood and fatigue  5. Physical Recovery  - Discussed patients delivery and complications - Patient had a first degree laceration, perineal healing reviewed. Patient expressed understanding - Patient has urinary incontinence? No  - Patient is safe to resume physical and sexual activity  6.  Health Maintenance - Last pap smear done 02/2019 and was normal with negative HPV.    Catalina Antigua, MD [facility] [employer], [facility]

## 2020-12-16 ENCOUNTER — Ambulatory Visit (INDEPENDENT_AMBULATORY_CARE_PROVIDER_SITE_OTHER): Payer: Medicaid Other | Admitting: Obstetrics

## 2020-12-16 ENCOUNTER — Other Ambulatory Visit (HOSPITAL_COMMUNITY)
Admission: RE | Admit: 2020-12-16 | Discharge: 2020-12-16 | Disposition: A | Discharge status: Home / Self Care | Payer: Medicaid Other | Source: Ambulatory Visit | Attending: Obstetrics | Admitting: Obstetrics

## 2020-12-16 ENCOUNTER — Other Ambulatory Visit: Payer: Self-pay

## 2020-12-16 ENCOUNTER — Encounter: Payer: Self-pay | Admitting: Obstetrics

## 2020-12-16 VITALS — BP 97/57 | HR 81 | Ht 61.0 in | Wt 107.0 lb

## 2020-12-16 DIAGNOSIS — Z01419 Encounter for gynecological examination (general) (routine) without abnormal findings: Secondary | ICD-10-CM | POA: Diagnosis present

## 2020-12-16 DIAGNOSIS — N946 Dysmenorrhea, unspecified: Secondary | ICD-10-CM | POA: Diagnosis not present

## 2020-12-16 DIAGNOSIS — N898 Other specified noninflammatory disorders of vagina: Secondary | ICD-10-CM

## 2020-12-16 DIAGNOSIS — Z3009 Encounter for other general counseling and advice on contraception: Secondary | ICD-10-CM

## 2020-12-16 MED ORDER — IBUPROFEN 800 MG PO TABS: 800.0000 mg | ORAL_TABLET | Freq: Three times a day (TID) | ORAL | 5 refills | Status: AC | PRN

## 2020-12-16 NOTE — Progress Notes (Signed)
GYN presents for AEX/PAP/STD cultures.  She is not using any BC and is sexually active.

## 2020-12-16 NOTE — Progress Notes (Signed)
Subjective:        Katherine Schmidt is a 25 y.o. female here for a routine exam.  Current complaints: Vaginal discharge.    Personal health questionnaire:  Is patient Ashkenazi Jewish, have a family history of breast and/or ovarian cancer: no Is there a family history of uterine cancer diagnosed at age < 42, gastrointestinal cancer, urinary tract cancer, family member who is a Personnel officer syndrome-associated carrier: no Is the patient overweight and hypertensive, family history of diabetes, personal history of gestational diabetes, preeclampsia or PCOS: no Is patient over 56, have PCOS,  family history of premature CHD under age 90, diabetes, smoke, have hypertension or peripheral artery disease:  no At any time, has a partner hit, kicked or otherwise hurt or frightened you?: no Over the past 2 weeks, have you felt down, depressed or hopeless?: no Over the past 2 weeks, have you felt little interest or pleasure in doing things?:no   Gynecologic History Patient's last menstrual period was 12/02/2020 (exact date). Contraception: none Last Pap: 03-01-2019. Results were: normal Last mammogram: n/a. Results were: n/a  Obstetric History OB History  Gravida Para Term Preterm AB Living  1 1 1     1   SAB IAB Ectopic Multiple Live Births        0 1    # Outcome Date GA Lbr Len/2nd Weight Sex Delivery Anes PTL Lv  1 Term 08/12/19 [redacted]w[redacted]d / 00:14 6 lb 7.5 oz (2.934 kg) M Vag-Spont Local  LIV    History reviewed. No pertinent past medical history.  History reviewed. No pertinent surgical history.   Current Outpatient Medications:    ibuprofen (ADVIL) 800 MG tablet, Take 1 tablet (800 mg total) by mouth every 8 (eight) hours as needed., Disp: 30 tablet, Rfl: 5   ibuprofen (ADVIL) 600 MG tablet, Take 1 tablet (600 mg total) by mouth every 6 (six) hours. (Patient not taking: Reported on 12/16/2020), Disp: 30 tablet, Rfl: 0   Prenatal Vit-Fe Fumarate-FA (PRENATAL MULTIVITAMIN) TABS tablet, Take 1  tablet by mouth daily at 12 noon. (Patient not taking: Reported on 12/16/2020), Disp: , Rfl:  No Known Allergies  Social History   Tobacco Use   Smoking status: Never   Smokeless tobacco: Never  Substance Use Topics   Alcohol use: Yes    History reviewed. No pertinent family history.    Review of Systems  Constitutional: negative for fatigue and weight loss Respiratory: negative for cough and wheezing Cardiovascular: negative for chest pain, fatigue and palpitations Gastrointestinal: negative for abdominal pain and change in bowel habits Musculoskeletal:negative for myalgias Neurological: negative for gait problems and tremors Behavioral/Psych: negative for abusive relationship, depression Endocrine: negative for temperature intolerance    Genitourinary: positive for vaginal discharge.  negative for abnormal menstrual periods, genital lesions, hot flashes, sexual problems  Integument/breast: negative for breast lump, breast tenderness, nipple discharge and skin lesion(s)    Objective:       BP (!) 97/57   Pulse 81   Ht 5\' 1"  (1.549 m)   Wt 107 lb (48.5 kg)   LMP 12/02/2020 (Exact Date)   BMI 20.22 kg/m  General:   Alert and no distress  Skin:   no rash or abnormalities  Lungs:   clear to auscultation bilaterally  Heart:   regular rate and rhythm, S1, S2 normal, no murmur, click, rub or gallop  Breasts:   normal without suspicious masses, skin or nipple changes or axillary nodes  Abdomen:  normal findings: no organomegaly,  soft, non-tender and no hernia  Pelvis:  External genitalia: normal general appearance Urinary system: urethral meatus normal and bladder without fullness, nontender Vaginal: normal without tenderness, induration or masses Cervix: normal appearance Adnexa: normal bimanual exam Uterus: anteverted and non-tender, normal size   Lab Review Urine pregnancy test Labs reviewed yes Radiologic studies reviewed no  I have spent a total of 20 minutes of  face-to-face and non-face-to-face time, excluding clinical staff time, reviewing notes and preparing to see patient, ordering tests and/or medications, and counseling the patient.   Assessment:    1. Encounter for gynecological examination with Papanicolaou smear of cervix Rx: - Cytology - PAP( [facility])  2. Vaginal discharge Rx: - Cervicovaginal ancillary only( [facility])  3. Dysmenorrhea Rx: - ibuprofen (ADVIL) 800 MG tablet; Take 1 tablet (800 mg total) by mouth every 8 (eight) hours as needed.  Dispense: 30 tablet; Refill: 5  4. Encounter for other general counseling and advice on contraception - options discussed.  Declines contraception.     Plan:    Education reviewed: calcium supplements, depression evaluation, low fat, low cholesterol diet, safe sex/STD prevention, self breast exams, and weight bearing exercise. Contraception: none. Follow up in: 1 year.   Meds ordered this encounter  Medications   ibuprofen (ADVIL) 800 MG tablet    Sig: Take 1 tablet (800 mg total) by mouth every 8 (eight) hours as needed.    Dispense:  30 tablet    Refill:  5    Brock Bad, MD 12/16/2020 9:09 AM

## 2020-12-17 ENCOUNTER — Other Ambulatory Visit: Payer: Self-pay | Admitting: Obstetrics

## 2020-12-17 DIAGNOSIS — N760 Acute vaginitis: Secondary | ICD-10-CM

## 2020-12-17 DIAGNOSIS — B3731 Acute candidiasis of vulva and vagina: Secondary | ICD-10-CM

## 2020-12-17 DIAGNOSIS — A749 Chlamydial infection, unspecified: Secondary | ICD-10-CM

## 2020-12-17 LAB — CERVICOVAGINAL ANCILLARY ONLY
Bacterial Vaginitis (gardnerella): POSITIVE — AB
Candida Glabrata: NEGATIVE
Candida Vaginitis: POSITIVE — AB
Chlamydia: POSITIVE — AB
Comment: NEGATIVE
Comment: NEGATIVE
Comment: NEGATIVE
Comment: NEGATIVE
Comment: NEGATIVE
Comment: NORMAL
Neisseria Gonorrhea: NEGATIVE
Trichomonas: NEGATIVE

## 2020-12-17 MED ORDER — DOXYCYCLINE HYCLATE 100 MG PO CAPS: 100.0000 mg | ORAL_CAPSULE | Freq: Two times a day (BID) | ORAL | 0 refills | Status: AC

## 2020-12-17 MED ORDER — METRONIDAZOLE 500 MG PO TABS: 500.0000 mg | ORAL_TABLET | Freq: Two times a day (BID) | ORAL | 2 refills | Status: AC

## 2020-12-17 MED ORDER — FLUCONAZOLE 150 MG PO TABS: 150.0000 mg | ORAL_TABLET | Freq: Once | ORAL | 0 refills | Status: AC

## 2020-12-19 LAB — CYTOLOGY - PAP: Diagnosis: NEGATIVE

## 2021-06-16 IMAGING — US US MFM OB DETAIL+14 WK
1 series · 13 of 28 positions shown · non-contrast
Comparison: none

[Series 1: us mfm ob detail+14 wk · 63 acquisitions, 13 frames shown]
[im 3/63]
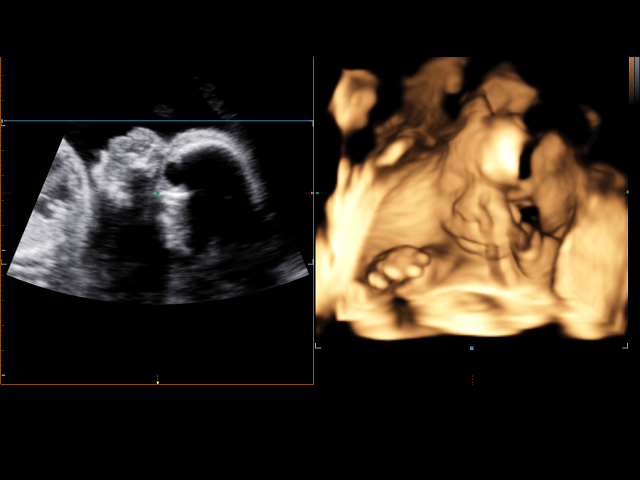
[im 7/63]
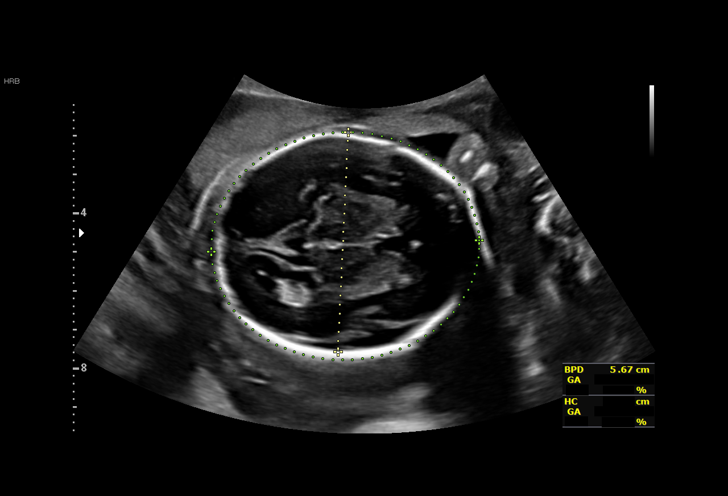
[im 12/63]
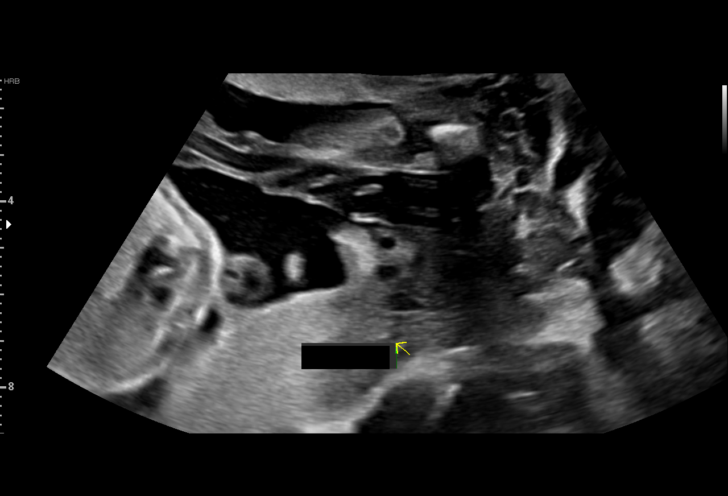
[im 17/63]
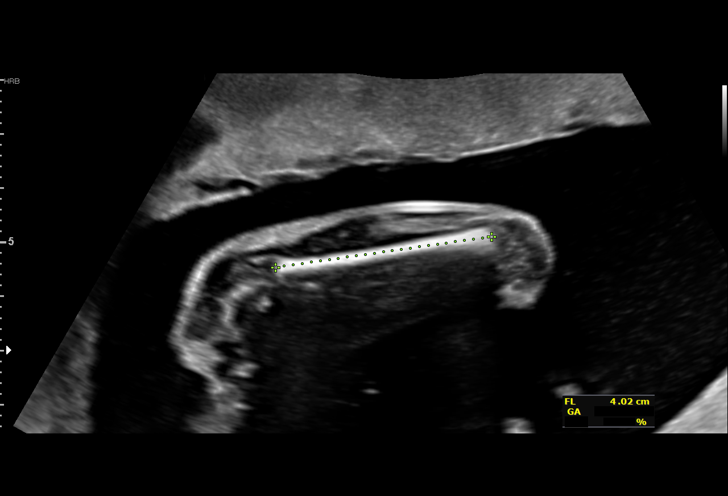
[im 21/63]
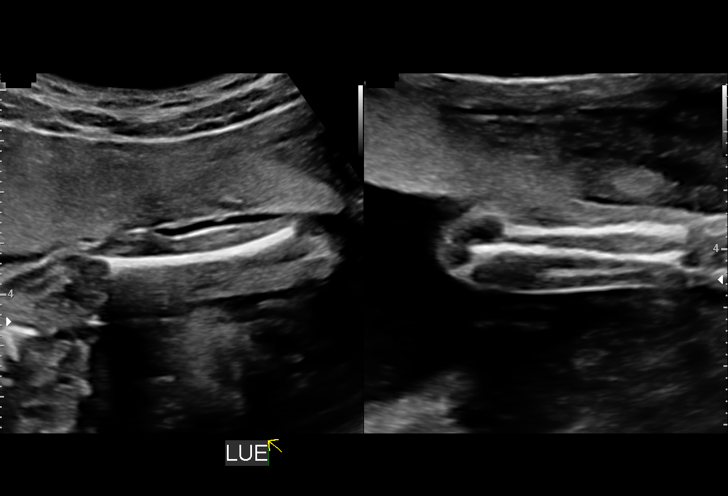
[im 26/63]
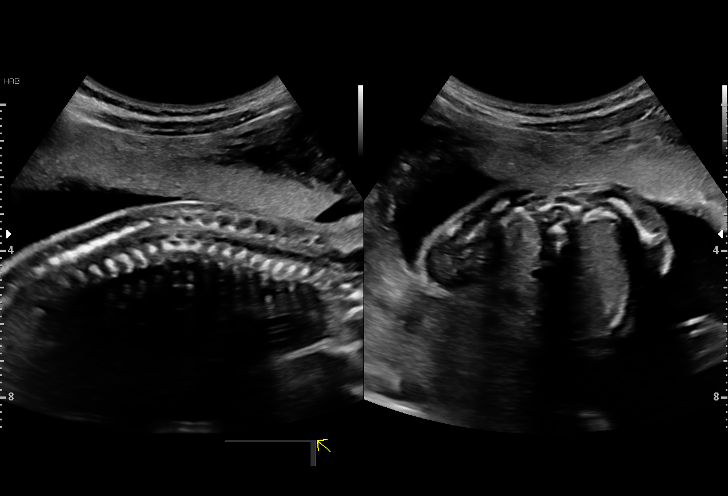
[im 33/63]
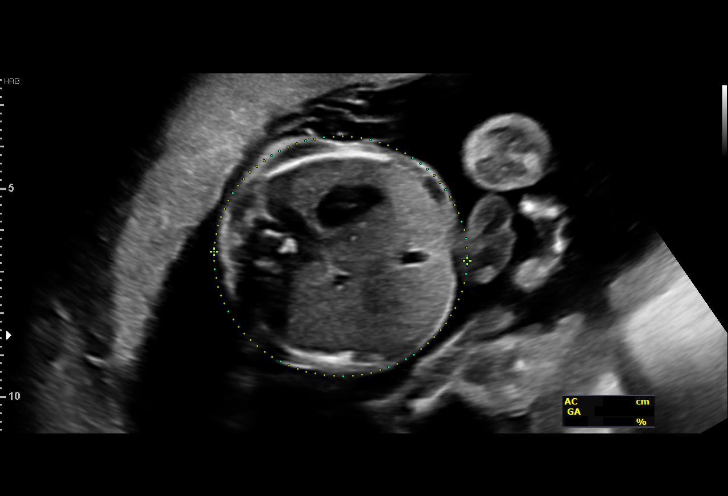
[im 37/63]
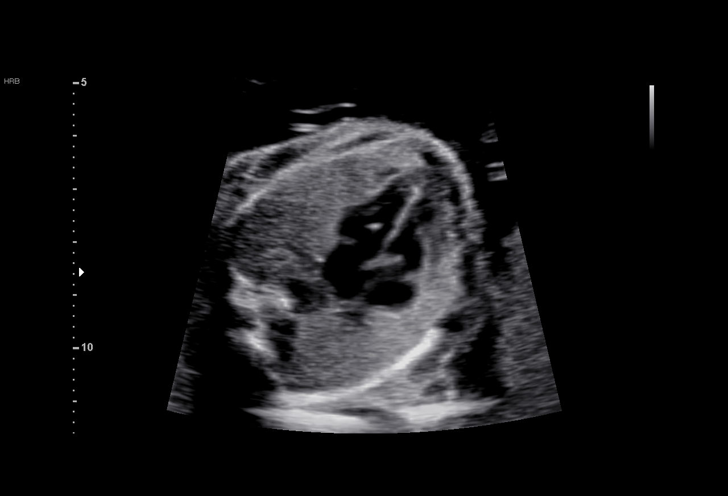
[im 42/63]
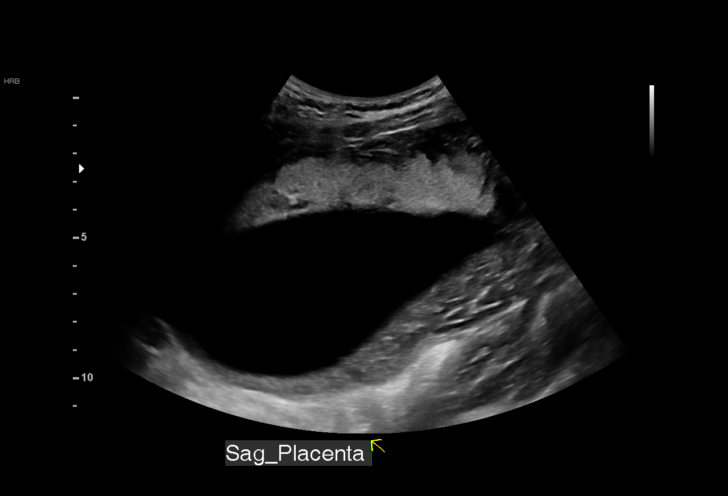
[im 46/63]
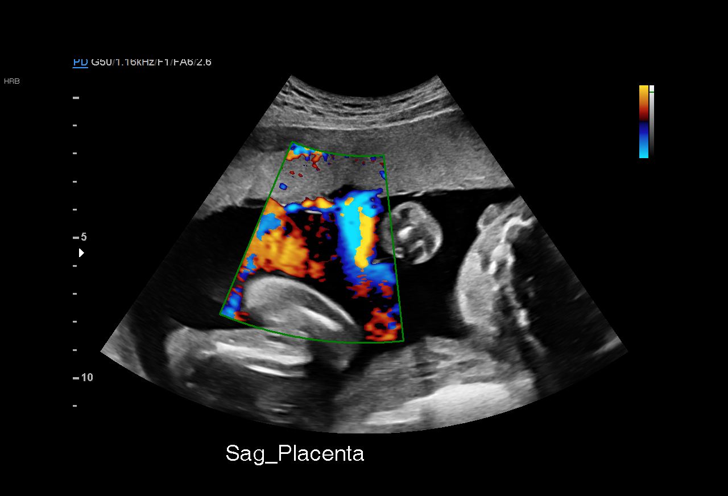
[im 51/63]
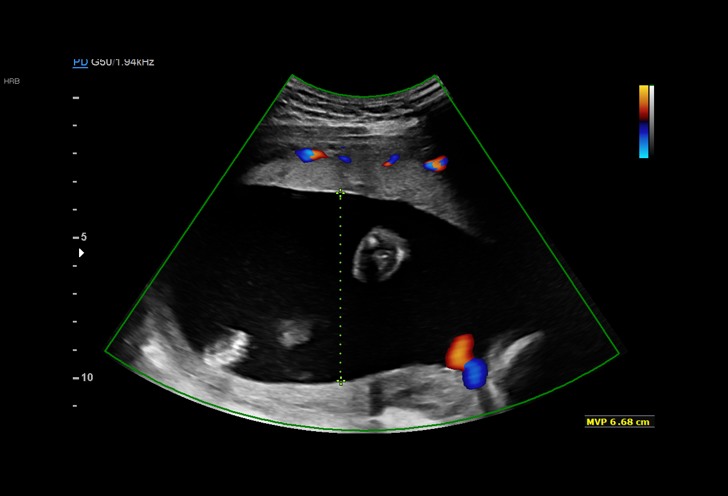
[im 56/63]
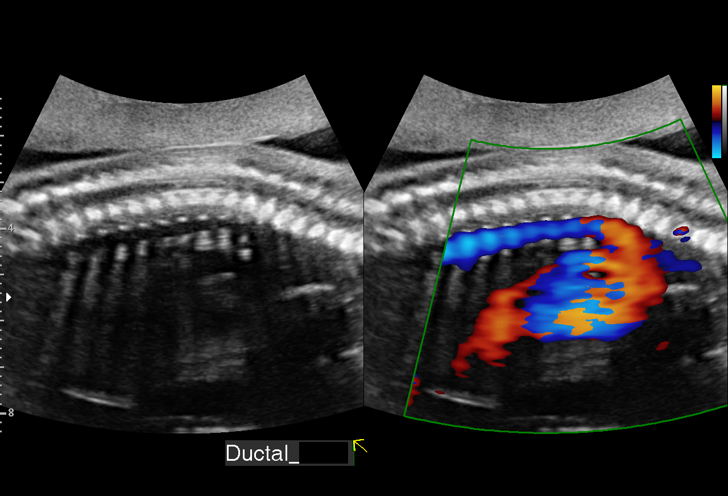
[im 60/63]
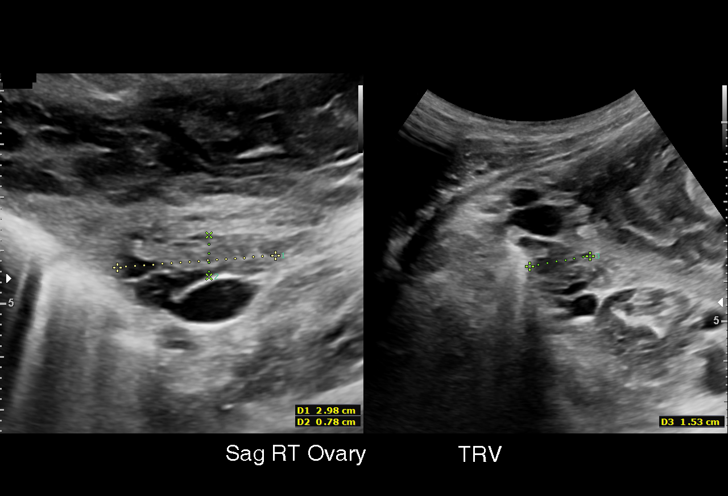

[13 of 28 positions shown; findings below may reference images not displayed]

1  US MFM OB DETAIL +14 WK              76811.01     BAGRAT
                                                       WIRESH
 ----------------------------------------------------------------------

 ----------------------------------------------------------------------
Indications

  Encounter for antenatal screening for
  malformations (low risk NIPS)
  Echogenic intracardiac focus of the heart
  (EIF)
  21 weeks gestation of pregnancy
 ----------------------------------------------------------------------
Vital Signs

 BMI:
Fetal Evaluation

 Num Of Fetuses:         1
 Cardiac Activity:       Observed
 Presentation:           Cephalic
 Placenta:               Anterior
 P. Cord Insertion:      Visualized, central

 Amniotic Fluid
 AFI FV:      Within normal limits

                             Largest Pocket(cm)

Biometry

 BPD:      56.5  mm     G. Age:  23w 2d         91  %    CI:        78.92   %    70 - 86
                                                         FL/HC:      19.7   %    18.4 -
 HC:      201.1  mm     G. Age:  22w 2d         56  %    HC/AC:      1.11        1.06 -
 AC:      181.9  mm     G. Age:  23w 0d         79  %    FL/BPD:     70.1   %    71 - 87
 FL:       39.6  mm     G. Age:  22w 5d         70  %    FL/AC:      21.8   %    20 - 24
 CER:      24.4  mm     G. Age:  22w 3d         60  %
 LV:        3.7  mm
 CM:        4.3  mm
 Est. FW:     541  gm      1 lb 3 oz     90  %
OB History

 Gravidity:    1
Gestational Age

 LMP:           21w 6d        Date:  11/25/18                 EDD:   09/01/19
 U/S Today:     22w 6d                                        EDD:   08/25/19
 Best:          21w 6d     Det. By:  LMP  (11/25/18)          EDD:   09/01/19
Anatomy

 Cranium:               Appears normal         Aortic Arch:            Appears normal
 Cavum:                 Appears normal         Ductal Arch:            Appears normal
 Ventricles:            Appears normal         Diaphragm:              Appears normal
 Choroid Plexus:        Appears normal         Stomach:                Appears normal, left
                                                                       sided
 Cerebellum:            Appears normal         Abdomen:                Appears normal
 Posterior Fossa:       Appears normal         Abdominal Wall:         Appears nml (cord
                                                                       insert, abd wall)
 Nuchal Fold:           Not applicable (>20    Cord Vessels:           Appears normal (3
                        wks GA)                                        vessel cord)
 Face:                  Appears normal         Kidneys:                Appear normal
                        (orbits and profile)
 Lips:                  Appears normal         Bladder:                Appears normal
 Thoracic:              Appears normal         Spine:                  Appears normal
 Heart:                 Echogenic focus        Upper Extremities:      Appears normal
                        in LV
 RVOT:                  Not well visualized    Lower Extremities:      Appears normal
 LVOT:                  Not well visualized

 Other:  Heels and 5th digit visualized. Technically difficult due to fetal position.
Cervix Uterus Adnexa

 Cervix
 Length:           3.76  cm.
 Normal appearance by transabdominal scan.

 Uterus
 No abnormality visualized.

 Left Ovary
 Within normal limits.

 Right Ovary
 Within normal limits.

 Cul De Sac
 No free fluid seen.

 Adnexa
 No abnormality visualized.
Impression

 We performed fetal anatomy scan. An echogenic intracardiac
 focus is seen. No other makers of aneuploidies or fetal
 structural defects are seen. Fetal biometry is consistent with
 her previously-established dates. Amniotic fluid is normal and
 good fetal activity is seen.

 On cell-free fetal DNA screening, the risks of fetal
 aneuploidies are not increased.
 I informed the patient that given that she had Menasiz Farsak for fetal
 aneuploidies on cell-free fetal DNA screening, finding of
 echogenic intracardiac focus should be considered a normal
 variant and that the risk of trisomy 21 is not increased. I also
 reassured that echogenic focus does not increase the risk of
 cardiac defects. I also informed her that only amniocentesis
 will give a defintive result on the fetal karyotype.
 Patient opted not to have amniocentesis.
Recommendations

 -An appointment was made for her to return in 4 weeks for
 completion of fetal anatomy.
                 Moolman, [id number]
